# Patient Record
Sex: Female | Born: 1950
Health system: Southern US, Community
[De-identification: ages and names within clinical notes are randomized; demographics above are authoritative.]

## PROBLEM LIST (undated history)

## (undated) DIAGNOSIS — J449 Chronic obstructive pulmonary disease, unspecified: Secondary | ICD-10-CM

## (undated) DIAGNOSIS — F419 Anxiety disorder, unspecified: Secondary | ICD-10-CM

## (undated) DIAGNOSIS — J310 Chronic rhinitis: Secondary | ICD-10-CM

## (undated) DIAGNOSIS — H469 Unspecified optic neuritis: Secondary | ICD-10-CM

## (undated) DIAGNOSIS — J45909 Unspecified asthma, uncomplicated: Secondary | ICD-10-CM

## (undated) DIAGNOSIS — I10 Essential (primary) hypertension: Secondary | ICD-10-CM

## (undated) DIAGNOSIS — E119 Type 2 diabetes mellitus without complications: Secondary | ICD-10-CM

## (undated) HISTORY — PX: NASAL SINUS SURGERY: SHX719

---

## 2012-02-17 DIAGNOSIS — R0602 Shortness of breath: Secondary | ICD-10-CM

## 2020-03-14 ENCOUNTER — Ambulatory Visit: Admission: EM | Admit: 2020-03-14 | Disposition: A | Discharge status: Home / Self Care | Payer: Self-pay

## 2020-03-14 ENCOUNTER — Encounter (HOSPITAL_COMMUNITY): Payer: Self-pay | Admitting: Emergency Medicine

## 2020-03-14 ENCOUNTER — Emergency Department (HOSPITAL_COMMUNITY): Payer: Medicare HMO

## 2020-03-14 ENCOUNTER — Other Ambulatory Visit: Payer: Self-pay

## 2020-03-14 ENCOUNTER — Encounter: Payer: Self-pay | Admitting: Emergency Medicine

## 2020-03-14 ENCOUNTER — Emergency Department (HOSPITAL_COMMUNITY)
Admission: EM | Admit: 2020-03-14 | Discharge: 2020-03-14 | Disposition: A | Discharge status: Home / Self Care | Payer: Medicare HMO | Source: Home / Self Care | Attending: Emergency Medicine | Admitting: Emergency Medicine

## 2020-03-14 ENCOUNTER — Ambulatory Visit
Admission: EM | Admit: 2020-03-14 | Discharge: 2020-03-14 | Disposition: A | Discharge status: Home / Self Care | Payer: Medicare HMO

## 2020-03-14 DIAGNOSIS — H538 Other visual disturbances: Secondary | ICD-10-CM | POA: Insufficient documentation

## 2020-03-14 DIAGNOSIS — J449 Chronic obstructive pulmonary disease, unspecified: Secondary | ICD-10-CM | POA: Insufficient documentation

## 2020-03-14 DIAGNOSIS — E119 Type 2 diabetes mellitus without complications: Secondary | ICD-10-CM | POA: Insufficient documentation

## 2020-03-14 DIAGNOSIS — H547 Unspecified visual loss: Secondary | ICD-10-CM | POA: Diagnosis not present

## 2020-03-14 DIAGNOSIS — I10 Essential (primary) hypertension: Secondary | ICD-10-CM | POA: Insufficient documentation

## 2020-03-14 DIAGNOSIS — J0130 Acute sphenoidal sinusitis, unspecified: Secondary | ICD-10-CM | POA: Diagnosis not present

## 2020-03-14 HISTORY — DX: Chronic obstructive pulmonary disease, unspecified: J44.9

## 2020-03-14 HISTORY — DX: Essential (primary) hypertension: I10

## 2020-03-14 HISTORY — DX: Type 2 diabetes mellitus without complications: E11.9

## 2020-03-14 LAB — COMPREHENSIVE METABOLIC PANEL
ALT: 16 U/L (ref 0–44)
AST: 17 U/L (ref 15–41)
Albumin: 4.2 g/dL (ref 3.5–5.0)
Alkaline Phosphatase: 39 U/L (ref 38–126)
Anion gap: 11 (ref 5–15)
BUN: 14 mg/dL (ref 8–23)
CO2: 27 mmol/L (ref 22–32)
Calcium: 9.6 mg/dL (ref 8.9–10.3)
Chloride: 101 mmol/L (ref 98–111)
Creatinine, Ser: 1.05 mg/dL — ABNORMAL HIGH (ref 0.44–1.00)
GFR calc Af Amer: >60 mL/min (ref >60)
GFR calc non Af Amer: 53 mL/min — ABNORMAL LOW (ref >60)
Glucose, Bld: 108 mg/dL — ABNORMAL HIGH (ref 70–99)
Potassium: 3.1 mmol/L — ABNORMAL LOW (ref 3.5–5.1)
Sodium: 139 mmol/L (ref 135–145)
Total Bilirubin: 0.8 mg/dL (ref 0.3–1.2)
Total Protein: 7.7 g/dL (ref 6.5–8.1)

## 2020-03-14 LAB — CBC WITH DIFFERENTIAL/PLATELET
Abs Immature Granulocytes: 0.03 10*3/uL (ref 0.00–0.07)
Basophils Absolute: 0 10*3/uL (ref 0.0–0.1)
Basophils Relative: 0 %
Eosinophils Absolute: 0.2 10*3/uL (ref 0.0–0.5)
Eosinophils Relative: 2 %
HCT: 42.5 % (ref 36.0–46.0)
Hemoglobin: 14.3 g/dL (ref 12.0–15.0)
Immature Granulocytes: 0 %
Lymphocytes Relative: 26 %
Lymphs Abs: 2.4 10*3/uL (ref 0.7–4.0)
MCH: 29.5 pg (ref 26.0–34.0)
MCHC: 33.6 g/dL (ref 30.0–36.0)
MCV: 87.6 fL (ref 80.0–100.0)
Monocytes Absolute: 0.7 10*3/uL (ref 0.1–1.0)
Monocytes Relative: 8 %
Neutro Abs: 5.7 10*3/uL (ref 1.7–7.7)
Neutrophils Relative %: 64 %
Platelets: 267 10*3/uL (ref 150–400)
RBC: 4.85 MIL/uL (ref 3.87–5.11)
RDW: 13.7 % (ref 11.5–15.5)
WBC: 9 10*3/uL (ref 4.0–10.5)
nRBC: 0 % (ref 0.0–0.2)

## 2020-03-14 IMAGING — CT CT HEAD W/O CM
3 series · 16 of 47 positions shown, 19 images · non-contrast
Comparison: None.

CLINICAL DATA: Monocular vision loss

EXAM:
CT HEAD WITHOUT CONTRAST
TECHNIQUE: Contiguous axial images were obtained from the base of the skull
through the vertex without intravenous contrast.

[Series 2: head w o · axial · 0.39mm/px · z∈[+343,+468]mm · 10 of 31 slices shown, 13 images]
[im 3/31  brain]
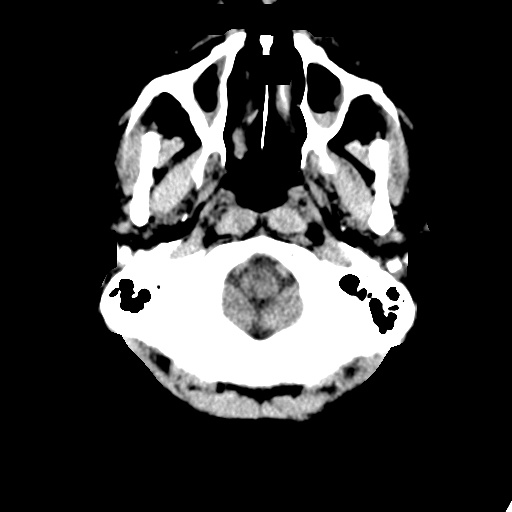
[im 3/31  bone]
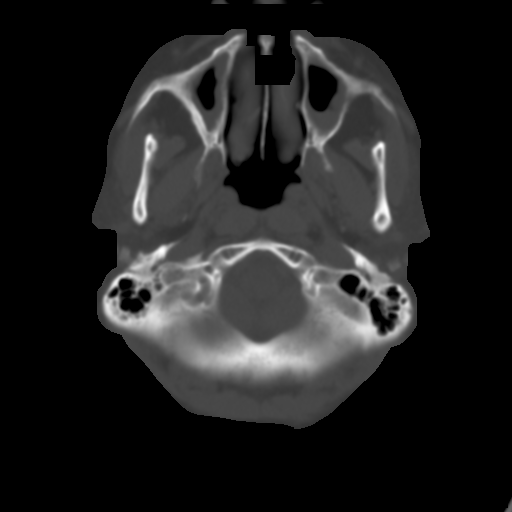
[im 6/31  brain]
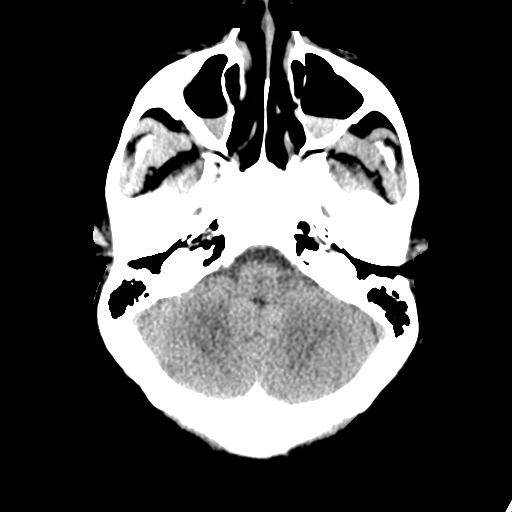
[im 9/31  brain]
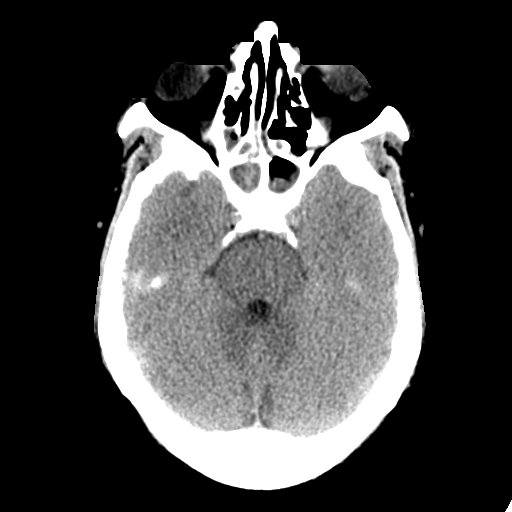
[im 11/31  brain]
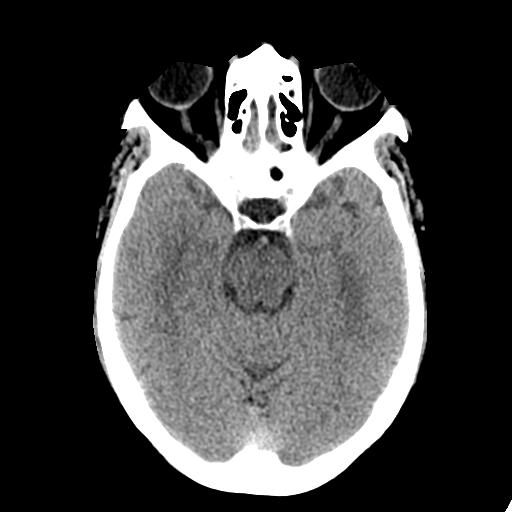
[im 14/31  brain]
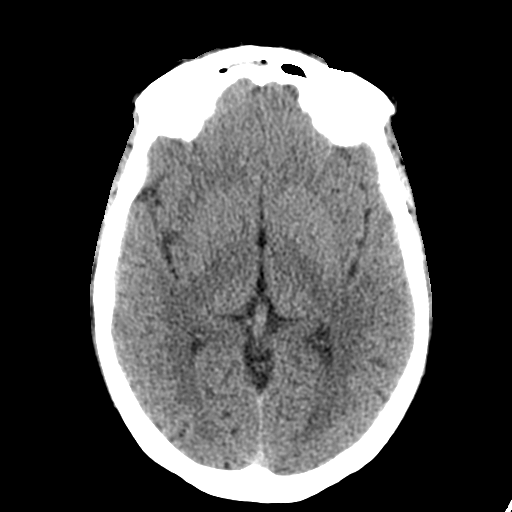
[im 14/31  bone]
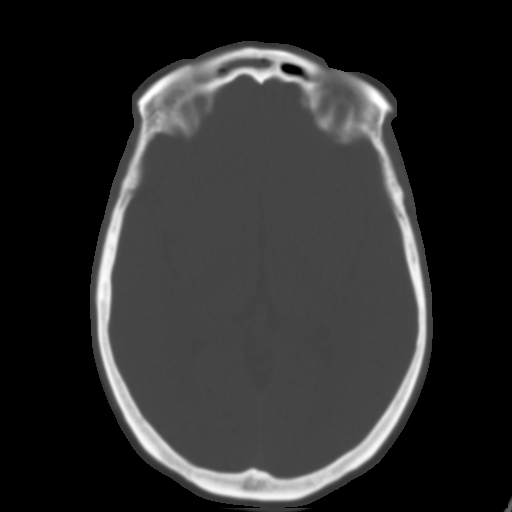
[im 17/31  brain]
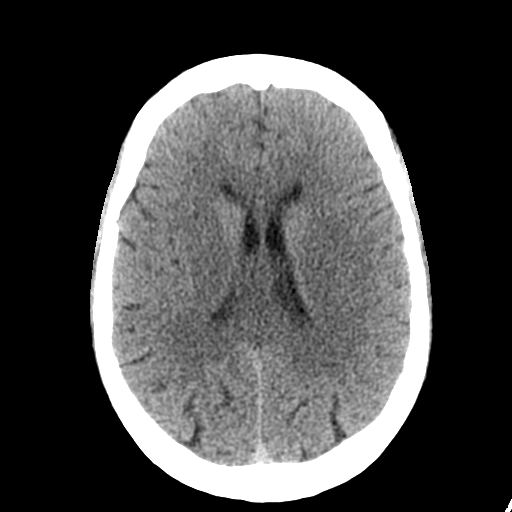
[im 20/31  brain]
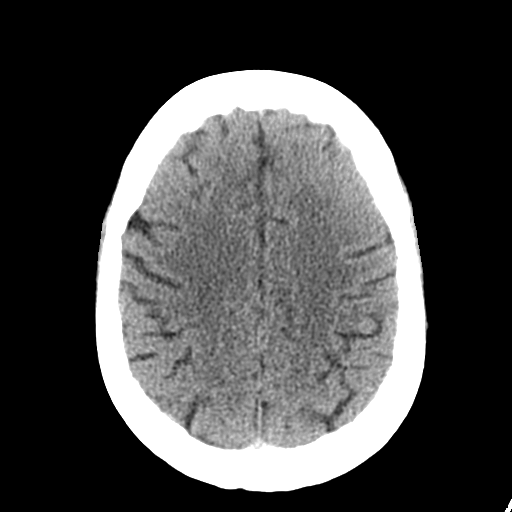
[im 23/31  brain]
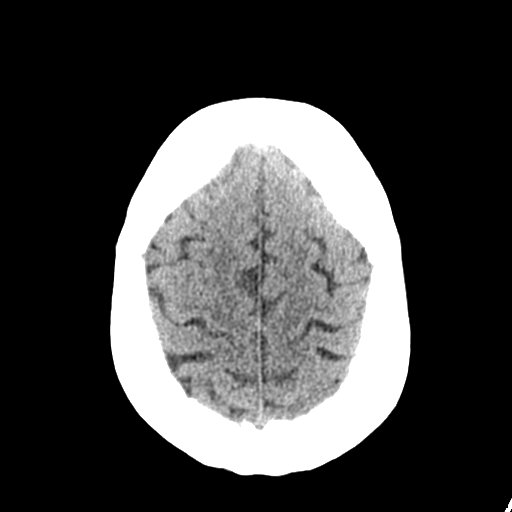
[im 25/31  brain]
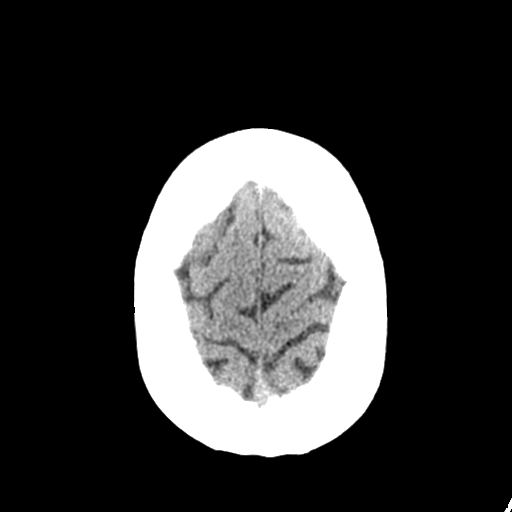
[im 25/31  bone]
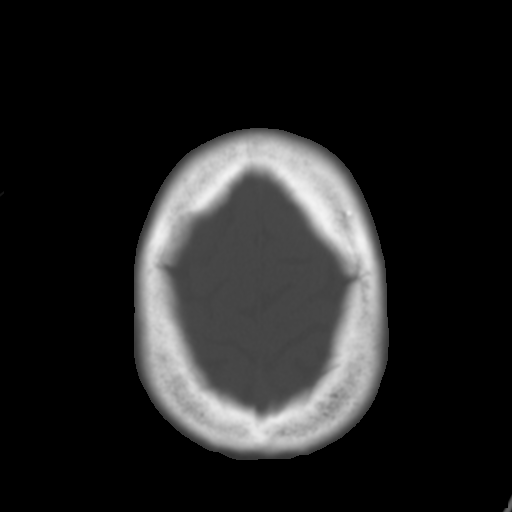
[im 28/31  brain]
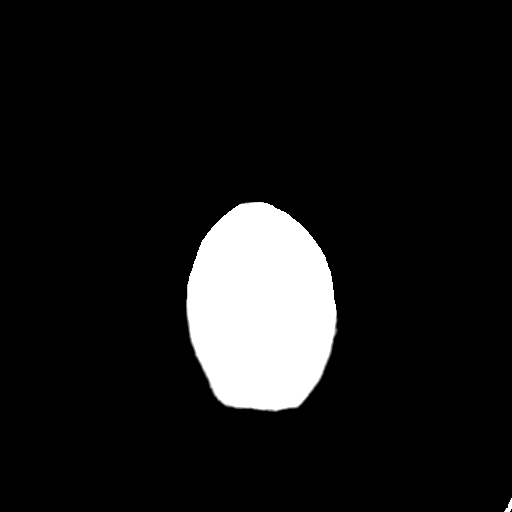

[Series 3: coronal soft · coronal · 0.37mm/px · 3 of 67 slices shown]
[im 23/67  brain]
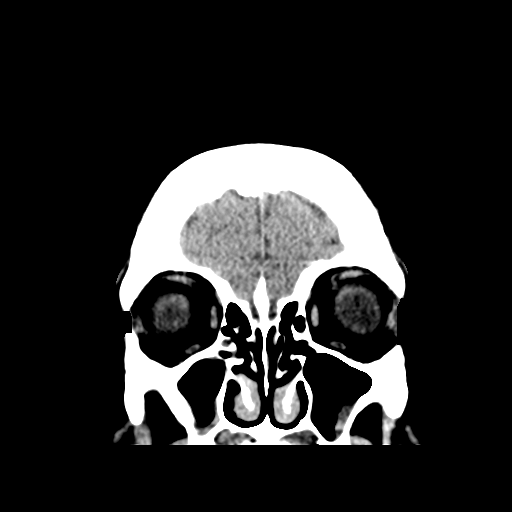
[im 30/67  brain]
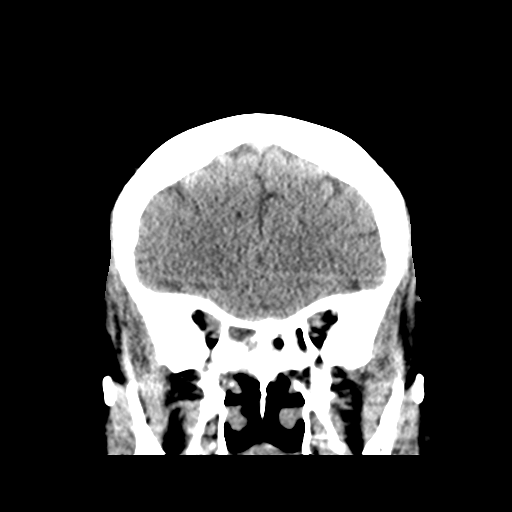
[im 37/67  brain]
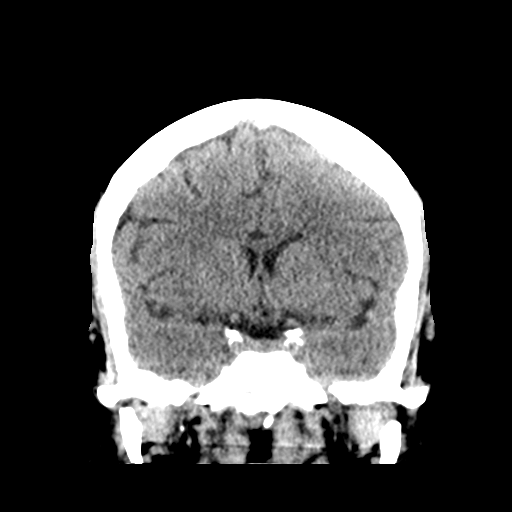

[Series 4: sagittal soft · sagittal · 0.32mm/px · 3 of 59 slices shown]
[im 20/59  brain]
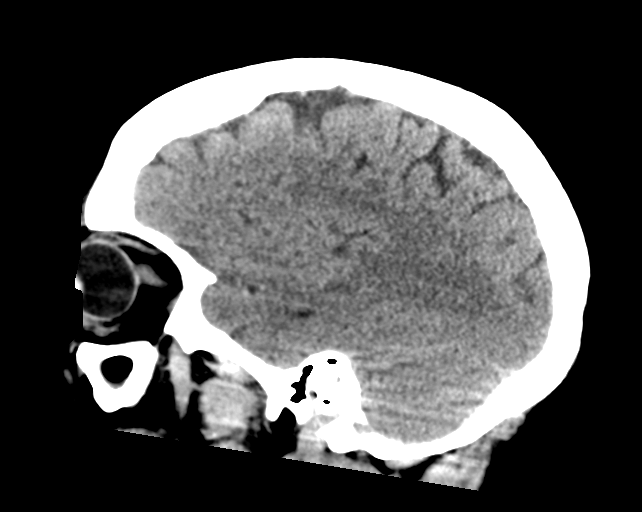
[im 30/59  brain]
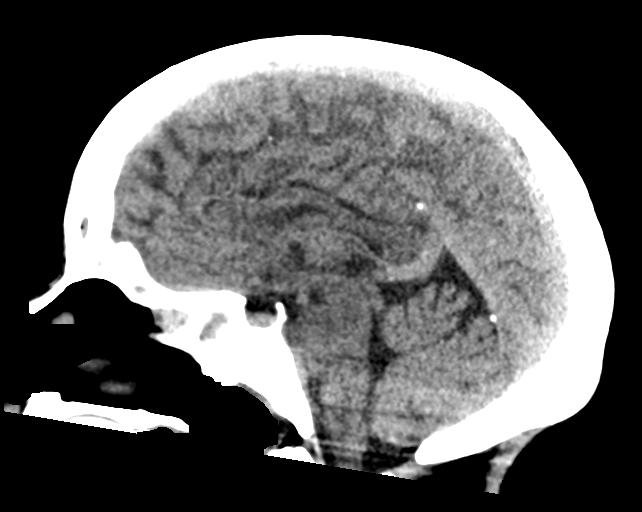
[im 39/59  brain]
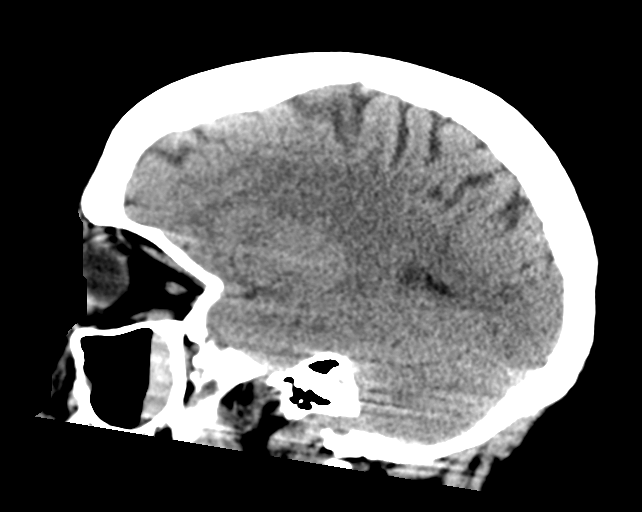

[16 of 47 positions shown; findings below may reference images not displayed]

FINDINGS: Brain: No evidence of acute territorial infarction, hemorrhage,
hydrocephalus,extra-axial collection or mass lesion/mass effect.
Normal gray-white differentiation. Ventricles are normal in size and
contour.

Vascular: No hyperdense vessel or unexpected calcification.

Skull: The skull is intact. No fracture or focal lesion identified.

Sinuses/Orbits: Fluid and mucosal thickening seen within the
bilateral maxillary sinuses and ethmoid air cells. There is also
complete opacification of the sphenoid air cells and right frontal
sinus. The orbits and globes intact.

Other: None
IMPRESSION: No acute intracranial abnormality.

Findings which could be suggestive of pansinusitis.

## 2020-03-14 NOTE — ED Triage Notes (Signed)
Blurred vision to right eye started yesterday about 1430.  Denies any problem with speech.  NIHSS negative.  Dr Effie Shy in triage.

## 2020-03-14 NOTE — ED Triage Notes (Signed)
Blurry vision to RT eye and headache that started yesterday evening approx 1430.  Denies any injury

## 2020-03-14 NOTE — ED Provider Notes (Signed)
[facility] EMERGENCY DEPARTMENT Provider Note   CSN: [id number] Arrival date & time: 03/14/20  1623     History Chief Complaint  Patient presents with  . Blurred Vision    Deborah Marsh is a 69 y.o. female.  HPI 4:45 PM-I received a call from triage nurse, regarding patient having blurred vision, waxing and waning for 24 hours.  Patient evaluated triage by me.  She states she has chronic visual loss left eye, central vision.  No change in vision from left eye.  She states her right eye is blurry, without any loss of visual field.  She is worried about a detached retina, because her mother had that.  Visual acuity, using Snellen chart-bilateral 20/70, OD 20/70, OS 20/100 -1.  She states that she has been seen by an eye doctor, Dr. Mayford Knife who was advised her that she has a cataract and needs to be worked on.    Past Medical History:  Diagnosis Date  . COPD (chronic obstructive pulmonary disease) (HCC)   . Diabetes mellitus without complication (HCC)   . Hypertension     There are no problems to display for this patient.   No past surgical history on file.   OB History   No obstetric history on file.     No family history on file.  Social History   Tobacco Use  . Smoking status: Never Smoker  . Smokeless tobacco: Never Used  Substance Use Topics  . Alcohol use: Never  . Drug use: Never    Home Medications Prior to Admission medications   Not on File    Allergies    Patient has no known allergies.  Review of Systems   Review of Systems  All other systems reviewed and are negative.   Physical Exam Updated Vital Signs There were no vitals taken for this visit.  Physical Exam Vitals and nursing note reviewed.  Constitutional:      General: She is not in acute distress.    Appearance: She is well-developed. She is obese. She is not ill-appearing, toxic-appearing or diaphoretic.  HENT:     Head: Normocephalic and atraumatic.     Right  Ear: External ear normal.     Left Ear: External ear normal.  Eyes:     Conjunctiva/sclera: Conjunctivae normal.     Pupils: Pupils are equal, round, and reactive to light.     Comments: External ocular muscles are intact.  Funduscopic exam eye-normal optic disc, no visualized vitreous lesions.  Generalized arteriolar narrowing, likely because of chronic disease.  No exudates or abnormalities visualized of the retina.  No hyphema.  No conjunctivitis.  Neck:     Trachea: Phonation normal.  Cardiovascular:     Rate and Rhythm: Normal rate and regular rhythm.     Heart sounds: Normal heart sounds.  Pulmonary:     Effort: Pulmonary effort is normal. No respiratory distress.     Breath sounds: Normal breath sounds. No stridor.  Abdominal:     General: There is no distension.  Musculoskeletal:        General: Normal range of motion.     Cervical back: Normal range of motion and neck supple.  Skin:    General: Skin is warm and dry.  Neurological:     Mental Status: She is alert and oriented to person, place, and time.     Cranial Nerves: No cranial nerve deficit.     Sensory: No sensory deficit.  Motor: No abnormal muscle tone.     Coordination: Coordination normal.  Psychiatric:        Mood and Affect: Mood normal.        Behavior: Behavior normal.        Thought Content: Thought content normal.        Judgment: Judgment normal.     ED Results / Procedures / Treatments   Labs (all labs ordered are listed, but only abnormal results are displayed) Labs Reviewed - No data to display  EKG None  Radiology No results found.  Procedures Procedures (including critical care time)  Medications Ordered in ED Medications - No data to display  ED Course  I have reviewed the triage vital signs and the nursing notes.  Pertinent labs & imaging results that were available during my care of the patient were reviewed by me and considered in my medical decision making (see chart for  details).  Clinical Course as of Mar 15 2147  Wynelle Link Mar 14, 2020  1824 Normal except potassium low, glucose high, creatinine high  Comprehensive metabolic panel(!) [EW]  1824 Normal  CBC with Differential [EW]  1825 No acute intracranial abnormality.  Sinusitis present, likely pansinusitis; per radiologist interpretation  CT Head Wo Contrast [EW]    Clinical Course User Index [EW] Mancel Bale, MD   MDM Rules/Calculators/A&P                           Patient Vitals for the past 24 hrs:  BP Temp Temp src Pulse Resp SpO2 Height Weight  03/14/20 1645 (!) 162/76 98.4 F (36.9 C) Oral (!) 49 16 99 % 5\' 4"  (1.626 m) 84.8 kg    9:52 PM Reevaluation with update and discussion. After initial assessment and treatment, an updated evaluation reveals no additional changes, findings discussed with the patient and all questions were answered.   Medical Decision Making:  This patient is presenting for evaluation of blurry vision right eye, which does require a range of treatment options, and is a complaint that involves a moderate risk of morbidity and mortality. The differential diagnoses include worsening chronic eye disorder, retinal detachment, conjunctivitis, CVA. I decided to review old records, and in summary patient with chronic vision loss left eye, now with blurriness of the right eye, for about a day. No trauma to the right eye. She states she has history of cataract in the right eye..  I did not require additional historical information from anyone.  Clinical Laboratory Tests Ordered, included CBC and Metabolic panel. Review indicates potassium low, glucose high, creatinine high, GFR low. Radiologic Tests Ordered, included CT head.  I independently Visualized: Radiographic images, which show no acute abnormality   Critical Interventions-clinical evaluation, laboratory testing, CT imaging, observation reassessment  After These Interventions, the Patient was reevaluated  and was found stable for discharge. Nonspecific blurry vision right eye. Visual acuity is mildly depressed, but no baseline acuity testing is available. No indication for acute ophthalmologic emergency at this time. Stable for discharge with outpatient follow-up with ophthalmology.  CRITICAL CARE-no Performed by: Mancel Bale  Nursing Notes Reviewed/ Care Coordinated Applicable Imaging Reviewed Interpretation of Laboratory Data incorporated into ED treatment  The patient appears reasonably screened and/or stabilized for discharge and I doubt any other medical condition or other [facility] requiring further screening, evaluation, or treatment in the ED at this time prior to discharge.  Plan: Home Medications-continue usual; Home Treatments-rest, fluids; return here  if the recommended treatment, does not improve the symptoms; Recommended follow up-ophthalmology follow-up for further evaluation and treatment as needed     Final Clinical Impression(s) / ED Diagnoses Final diagnoses:  None    Rx / DC Orders ED Discharge Orders    None       Mancel Bale, MD 03/15/20 0005

## 2020-03-14 NOTE — ED Notes (Signed)
Patient is being discharged from the Urgent Care and sent to the Emergency Department via pov . Per Doyce Para, patient is in need of higher level of care due to r/o stroke. Patient is aware and verbalizes understanding of plan of care.  Vitals:   03/14/20 1609  BP: 136/77  Pulse: (!) 49  Resp: 18  Temp: 98.5 F (36.9 C)  SpO2: 97%

## 2020-03-14 NOTE — Discharge Instructions (Addendum)
The CT scan your blood work was normal today.  Call the on-call ophthalmologist for an appointment to be seen for evaluation of your blurred vision.

## 2020-03-15 ENCOUNTER — Encounter (HOSPITAL_COMMUNITY): Payer: Self-pay

## 2020-03-15 ENCOUNTER — Emergency Department (HOSPITAL_COMMUNITY): Payer: Medicare HMO

## 2020-03-15 ENCOUNTER — Inpatient Hospital Stay (HOSPITAL_COMMUNITY)
Admission: EM | Admit: 2020-03-15 | Discharge: 2020-03-18 | DRG: 153 | Disposition: A | Discharge status: Home Health | Payer: Medicare HMO | Attending: Internal Medicine | Admitting: Internal Medicine

## 2020-03-15 DIAGNOSIS — Z87891 Personal history of nicotine dependence: Secondary | ICD-10-CM

## 2020-03-15 DIAGNOSIS — Z20822 Contact with and (suspected) exposure to covid-19: Secondary | ICD-10-CM | POA: Diagnosis present

## 2020-03-15 DIAGNOSIS — H269 Unspecified cataract: Secondary | ICD-10-CM | POA: Diagnosis present

## 2020-03-15 DIAGNOSIS — H4710 Unspecified papilledema: Secondary | ICD-10-CM | POA: Diagnosis present

## 2020-03-15 DIAGNOSIS — Z79899 Other long term (current) drug therapy: Secondary | ICD-10-CM

## 2020-03-15 DIAGNOSIS — I10 Essential (primary) hypertension: Secondary | ICD-10-CM | POA: Diagnosis present

## 2020-03-15 DIAGNOSIS — E669 Obesity, unspecified: Secondary | ICD-10-CM | POA: Diagnosis present

## 2020-03-15 DIAGNOSIS — Z6832 Body mass index (BMI) 32.0-32.9, adult: Secondary | ICD-10-CM

## 2020-03-15 DIAGNOSIS — K5900 Constipation, unspecified: Secondary | ICD-10-CM | POA: Diagnosis present

## 2020-03-15 DIAGNOSIS — J0130 Acute sphenoidal sinusitis, unspecified: Principal | ICD-10-CM | POA: Diagnosis present

## 2020-03-15 DIAGNOSIS — R8271 Bacteriuria: Secondary | ICD-10-CM | POA: Diagnosis present

## 2020-03-15 DIAGNOSIS — H5461 Unqualified visual loss, right eye, normal vision left eye: Secondary | ICD-10-CM

## 2020-03-15 DIAGNOSIS — H47091 Other disorders of optic nerve, not elsewhere classified, right eye: Secondary | ICD-10-CM | POA: Diagnosis present

## 2020-03-15 DIAGNOSIS — H543 Unqualified visual loss, both eyes: Secondary | ICD-10-CM | POA: Diagnosis present

## 2020-03-15 DIAGNOSIS — N390 Urinary tract infection, site not specified: Secondary | ICD-10-CM | POA: Diagnosis present

## 2020-03-15 DIAGNOSIS — R7303 Prediabetes: Secondary | ICD-10-CM | POA: Diagnosis present

## 2020-03-15 DIAGNOSIS — J449 Chronic obstructive pulmonary disease, unspecified: Secondary | ICD-10-CM | POA: Diagnosis present

## 2020-03-15 LAB — RAPID URINE DRUG SCREEN, HOSP PERFORMED
Amphetamines: NOT DETECTED
Barbiturates: NOT DETECTED
Benzodiazepines: POSITIVE — AB
Cocaine: NOT DETECTED
Opiates: NOT DETECTED
Tetrahydrocannabinol: NOT DETECTED

## 2020-03-15 LAB — CBC
HCT: 45.3 % (ref 36.0–46.0)
Hemoglobin: 14.9 g/dL (ref 12.0–15.0)
MCH: 28.6 pg (ref 26.0–34.0)
MCHC: 32.9 g/dL (ref 30.0–36.0)
MCV: 86.9 fL (ref 80.0–100.0)
Platelets: 272 10*3/uL (ref 150–400)
RBC: 5.21 MIL/uL — ABNORMAL HIGH (ref 3.87–5.11)
RDW: 13.5 % (ref 11.5–15.5)
WBC: 9.1 10*3/uL (ref 4.0–10.5)
nRBC: 0 % (ref 0.0–0.2)

## 2020-03-15 LAB — URINALYSIS, ROUTINE W REFLEX MICROSCOPIC
Bilirubin Urine: NEGATIVE
Glucose, UA: NEGATIVE mg/dL
Hgb urine dipstick: NEGATIVE
Ketones, ur: 20 mg/dL — AB
Nitrite: NEGATIVE
Protein, ur: NEGATIVE mg/dL
Specific Gravity, Urine: 1.013 (ref 1.005–1.030)
pH: 6 (ref 5.0–8.0)

## 2020-03-15 LAB — DIFFERENTIAL
Abs Immature Granulocytes: 0.03 10*3/uL (ref 0.00–0.07)
Basophils Absolute: 0 10*3/uL (ref 0.0–0.1)
Basophils Relative: 0 %
Eosinophils Absolute: 0.3 10*3/uL (ref 0.0–0.5)
Eosinophils Relative: 3 %
Immature Granulocytes: 0 %
Lymphocytes Relative: 18 %
Lymphs Abs: 1.7 10*3/uL (ref 0.7–4.0)
Monocytes Absolute: 0.6 10*3/uL (ref 0.1–1.0)
Monocytes Relative: 7 %
Neutro Abs: 6.5 10*3/uL (ref 1.7–7.7)
Neutrophils Relative %: 72 %

## 2020-03-15 LAB — COMPREHENSIVE METABOLIC PANEL
ALT: 16 U/L (ref 0–44)
AST: 14 U/L — ABNORMAL LOW (ref 15–41)
Albumin: 4.1 g/dL (ref 3.5–5.0)
Alkaline Phosphatase: 40 U/L (ref 38–126)
Anion gap: 10 (ref 5–15)
BUN: 11 mg/dL (ref 8–23)
CO2: 30 mmol/L (ref 22–32)
Calcium: 9.8 mg/dL (ref 8.9–10.3)
Chloride: 100 mmol/L (ref 98–111)
Creatinine, Ser: 0.92 mg/dL (ref 0.44–1.00)
GFR calc Af Amer: >60 mL/min (ref >60)
GFR calc non Af Amer: >60 mL/min (ref >60)
Glucose, Bld: 95 mg/dL (ref 70–99)
Potassium: 3.6 mmol/L (ref 3.5–5.1)
Sodium: 140 mmol/L (ref 135–145)
Total Bilirubin: 1 mg/dL (ref 0.3–1.2)
Total Protein: 7.6 g/dL (ref 6.5–8.1)

## 2020-03-15 LAB — PROTIME-INR
INR: 1.1 (ref 0.8–1.2)
Prothrombin Time: 13.6 seconds (ref 11.4–15.2)

## 2020-03-15 LAB — APTT: aPTT: 29 seconds (ref 24–36)

## 2020-03-15 LAB — ETHANOL: Alcohol, Ethyl (B): <10 mg/dL (ref <10)

## 2020-03-15 IMAGING — MR MR HEAD W/O CM
12 of 14 series · 40 of 48 positions shown · IV contrast (gadavist)
Comparison: Head CT [DATE]

CLINICAL DATA: Worsening right eye vision loss. Concern for stroke.

EXAM:
MR HEAD WITHOUT CONTRAST
MR CIRCLE OF WILLIS WITHOUT CONTRAST
MRA OF THE NECK WITHOUT AND WITH CONTRAST
TECHNIQUE: Multiplanar, multiecho pulse sequences of the brain, circle of
willis and surrounding structures were obtained without intravenous
contrast. Angiographic images of the neck were obtained using MRA
technique without and with intravenous contrast.
CONTRAST:  8mL GADAVIST GADOBUTROL 1 MMOL/ML IV SOLN

[Series 5: ax dwi_tracew · axial · 3.0mm · 1.44mm/px · z∈[-98,+42]mm · 7 of 88 slices shown]
[im 1/88]
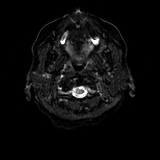
[im 15/88]
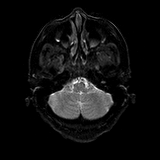
[im 30/88]
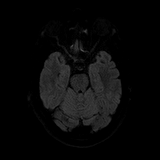
[im 44/88]
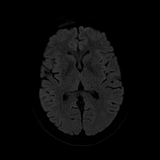
[im 59/88]
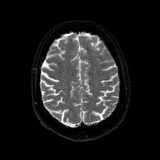
[im 73/88]
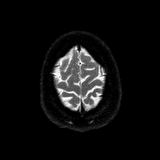
[im 88/88]
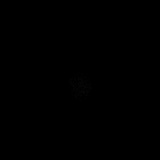

[Series 6: ax dwi_adc · axial · 3.0mm · 1.44mm/px · z∈[-98,+42]mm · 4 of 44 slices shown]
[im 1/44]
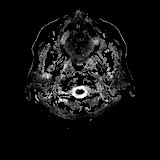
[im 15/44]
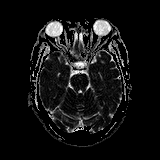
[im 29/44]
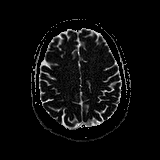
[im 44/44]
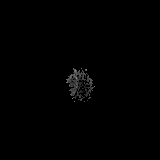

[Series 7: cor dwi_tracew · coronal · 5.0mm · 1.44mm/px · 5 of 64 slices shown]
[im 1/64]
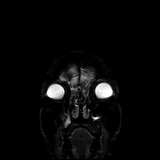
[im 16/64]
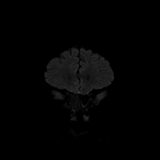
[im 32/64]
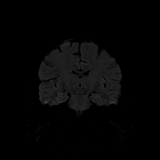
[im 48/64]
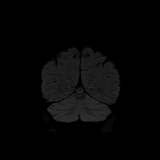
[im 64/64]
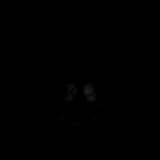

[Series 8: cor dwi_adc · coronal · 5.0mm · 1.44mm/px · 3 of 32 slices shown]
[im 1/32]
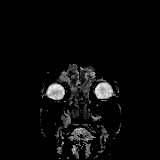
[im 16/32]
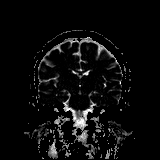
[im 32/32]
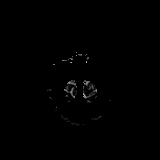

[Series 13: T1 · sagittal · 5.0mm · 0.72mm/px · 2 of 23 slices shown]
[im 1/23]
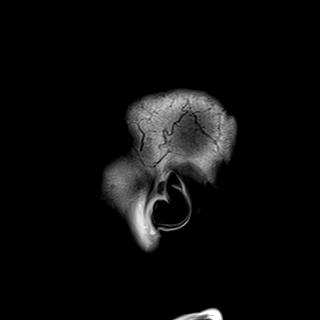
[im 23/23]
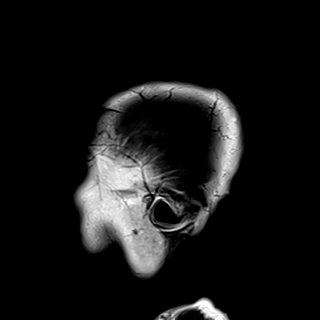

[Series 14: T2 · axial · 5.0mm · 0.75mm/px · z∈[-102,+39]mm · 2 of 25 slices shown]
[im 1/25]
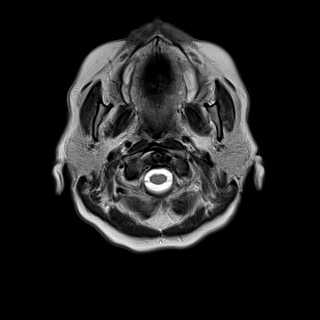
[im 25/25]
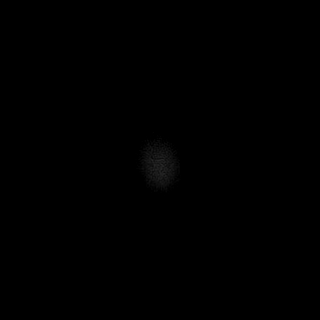

[Series 15: swi_images · axial · 3.0mm · 0.94mm/px · z∈[-119,+55]mm · 5 of 60 slices shown]
[im 1/60]
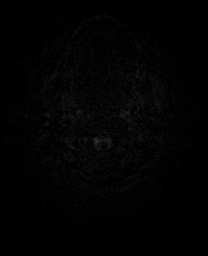
[im 15/60]
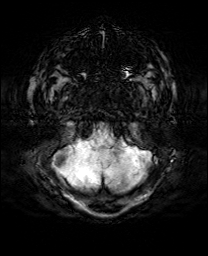
[im 30/60]
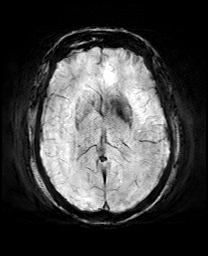
[im 45/60]
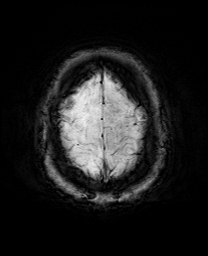
[im 60/60]
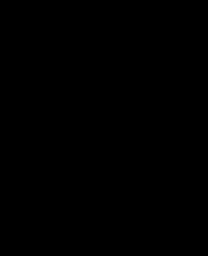

[Series 16: mip_images(sw) · axial · 24.0mm · 0.94mm/px · z∈[-108,+45]mm · 4 of 53 slices shown]
[im 1/53]
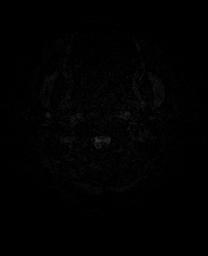
[im 18/53]
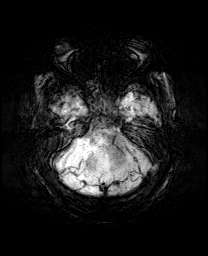
[im 35/53]
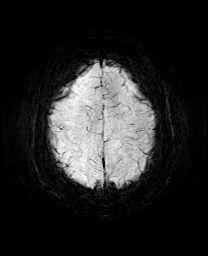
[im 53/53]
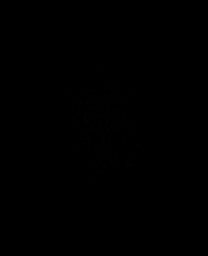

[Series 17: FLAIR · axial · 3.0mm · 0.47mm/px · z∈[-102,+39]mm · 2 of 25 slices shown]
[im 1/25]
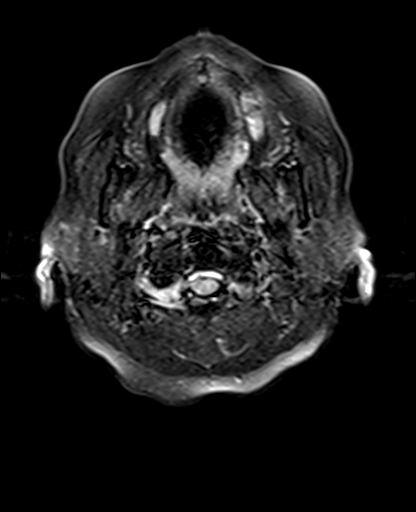
[im 25/25]
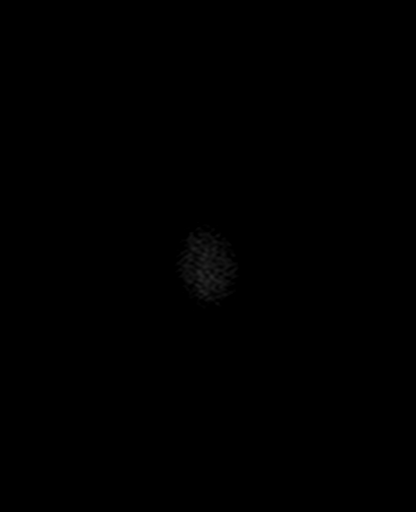

[Series 38: T2 post-contrast · coronal · 5.0mm · 0.72mm/px · 2 of 28 slices shown]
[im 1/28]
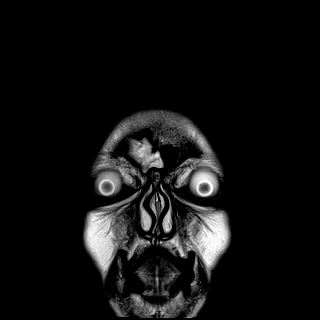
[im 28/28]
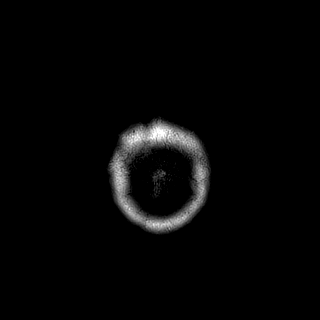

[Series 40: T1 post-contrast · coronal · 5.0mm · 0.34mm/px · 2 of 29 slices shown (1 of 2)]
[im 1/29]
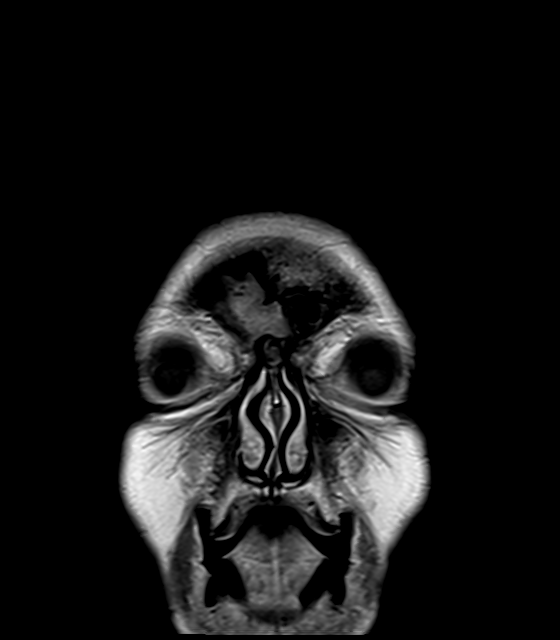
[im 29/29]
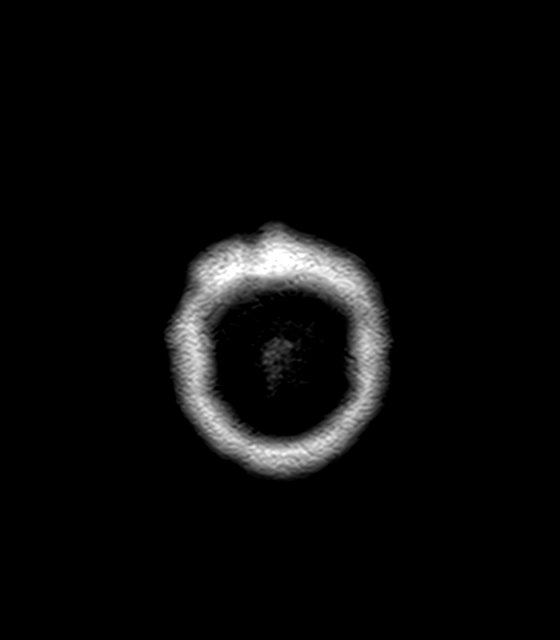

[Series 41: T1 post-contrast · sagittal · 5.0mm · 0.72mm/px · 2 of 23 slices shown (2 of 2)]
[im 1/23]
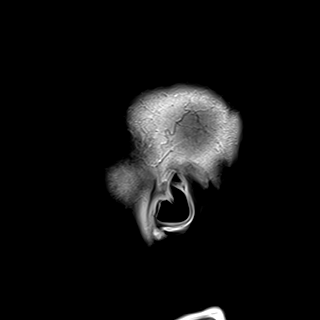
[im 23/23]
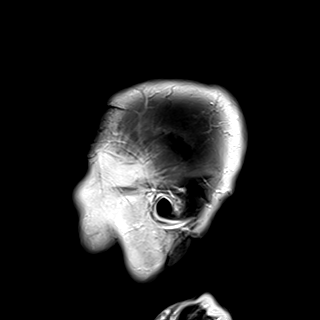

[40 of 48 positions shown; findings below may reference images not displayed]

FINDINGS: MR HEAD FINDINGS

Brain: There is no evidence of an acute infarct, intracranial
hemorrhage, mass, midline shift, or extra-axial fluid collection.
The ventricles and sulci are normal. Small foci of T2 hyperintensity
in the cerebral white matter bilaterally are nonspecific but
compatible with mild chronic small vessel ischemic disease. No
abnormal enhancement is identified.

Vascular: Major intracranial vascular flow voids are preserved.

Skull and upper cervical spine: Unremarkable bone marrow signal.

Sinuses/Orbits: Unremarkable orbits. Widespread mucosal thickening
in the paranasal sinuses with complete opacification of the right
frontal sinus and right sphenoid sinus and subtotal opacification of
the left sphenoid sinus. Bilateral maxillary sinus fluid. Clear
mastoid air cells.

Other: None.

MR CIRCLE OF WILLIS FINDINGS

The intracranial vertebral arteries are widely patent to the
basilar. The left PICA and right AICA appear dominant. Widely patent
SCA origins are seen bilaterally. The basilar artery is widely
patent. There are small posterior communicating arteries
bilaterally. Both PCAs are patent without evidence of a significant
proximal stenosis.

The internal carotid arteries are patent from skull base to carotid
termini without evidence of a significant stenosis. Artifact is
noted in the proximal petrous ICA segments bilaterally. ACAs and
MCAs are patent without evidence of a proximal branch occlusion or
significant proximal stenosis. No aneurysm is identified.

MRA NECK FINDINGS

The study is mildly motion degraded.

There is a standard 3 vessel aortic arch. The brachiocephalic and
subclavian arteries are widely patent.

The common carotid and cervical internal carotid arteries are patent
bilaterally without evidence of significant stenosis or dissection.
There is mild beading of the mid to distal right cervical ICA.

The vertebral arteries are patent and codominant with antegrade flow
bilaterally. No significant vertebral artery stenosis or dissection
is identified.
IMPRESSION: 1. No acute intracranial abnormality.
2. Mild chronic small vessel ischemic disease.
3. Pansinusitis.
4. Negative head MRA.
5. Widely patent cervical carotid and vertebral arteries.
6. Possible mild changes of fibromuscular dysplasia in the right
cervical ICA.

## 2020-03-15 IMAGING — MR MR MRA HEAD W/O CM
1 series · 18 of 48 positions shown · IV contrast (gadavist)
Comparison: Head CT [DATE]

CLINICAL DATA: Worsening right eye vision loss. Concern for stroke.

EXAM:
MR HEAD WITHOUT CONTRAST
MR CIRCLE OF WILLIS WITHOUT CONTRAST
MRA OF THE NECK WITHOUT AND WITH CONTRAST
TECHNIQUE: Multiplanar, multiecho pulse sequences of the brain, circle of
willis and surrounding structures were obtained without intravenous
contrast. Angiographic images of the neck were obtained using MRA
technique without and with intravenous contrast.
CONTRAST:  8mL GADAVIST GADOBUTROL 1 MMOL/ML IV SOLN

[Series 9: 3d cow · axial · 0.5mm · 0.41mm/px · z∈[-94,-14]mm · 18 of 172 slices shown]
[im 1/172]
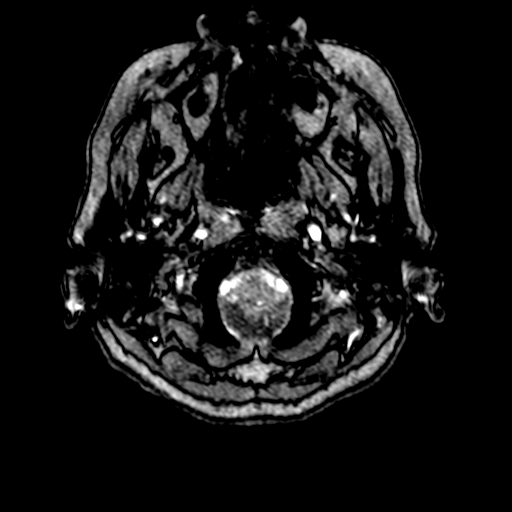
[im 4/172]
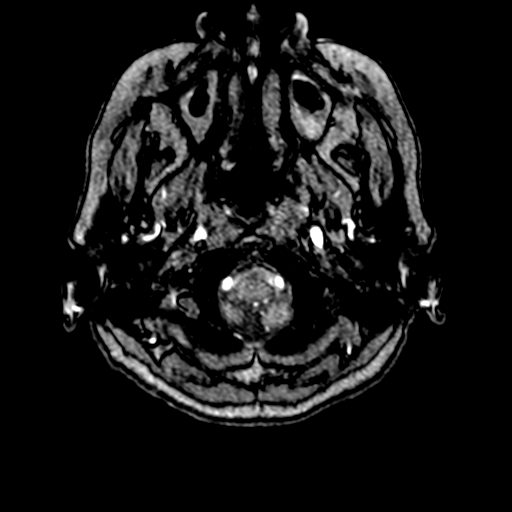
[im 8/172]
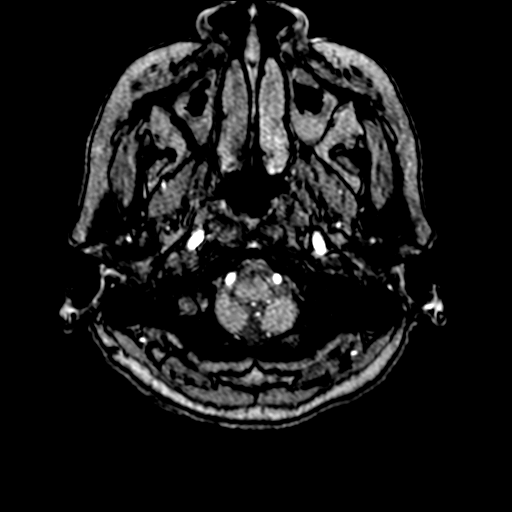
[im 11/172]
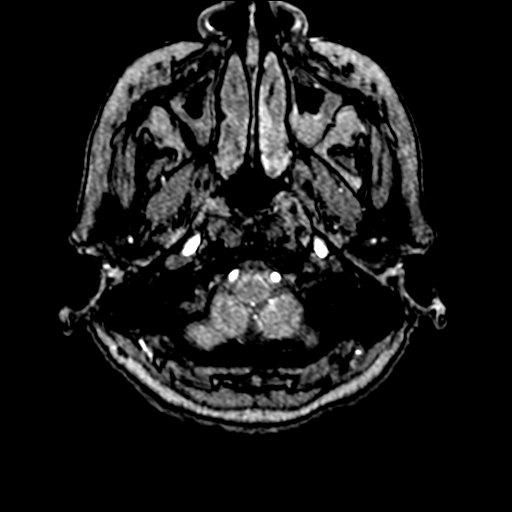
[im 15/172]
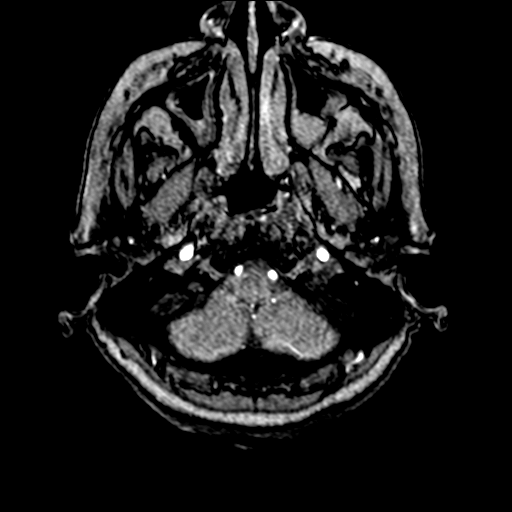
[im 19/172]
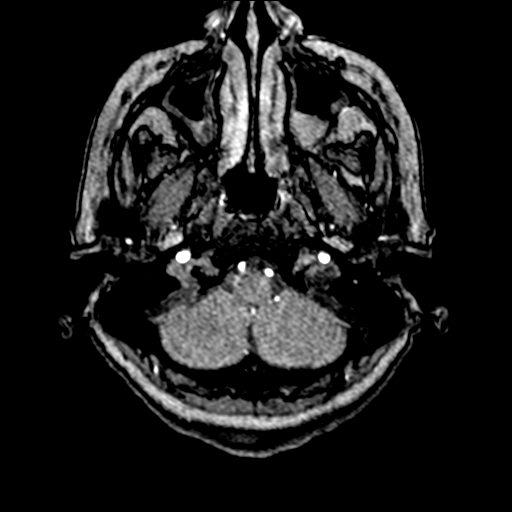
[im 22/172]
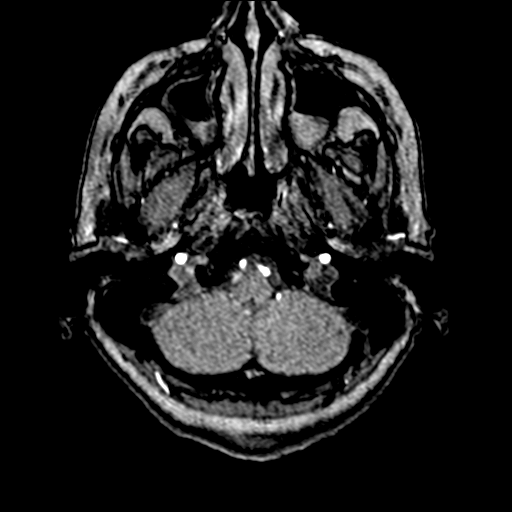
[im 26/172]
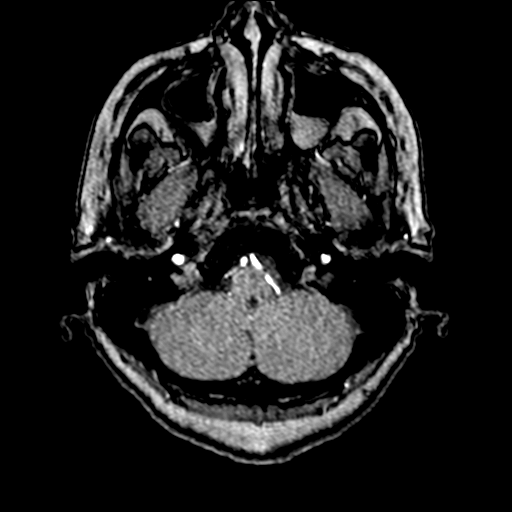
[im 30/172]
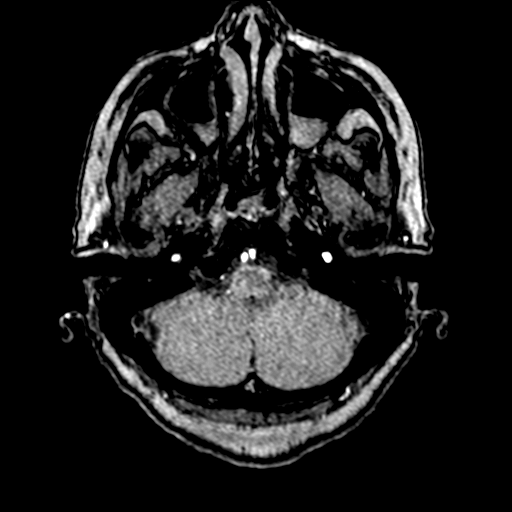
[im 33/172]
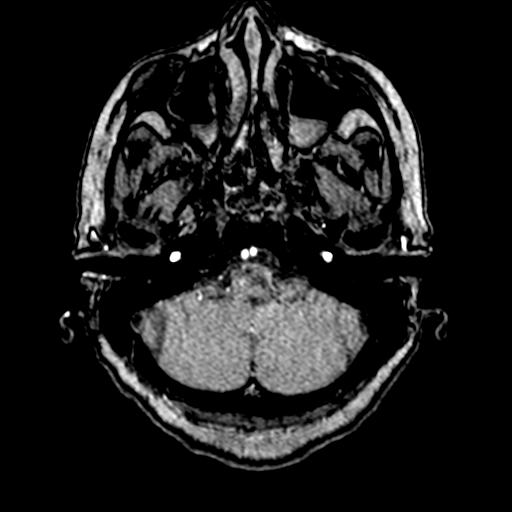
[im 55/172]
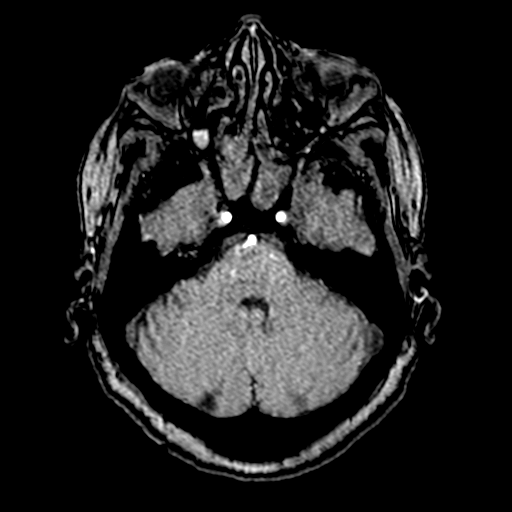
[im 77/172]
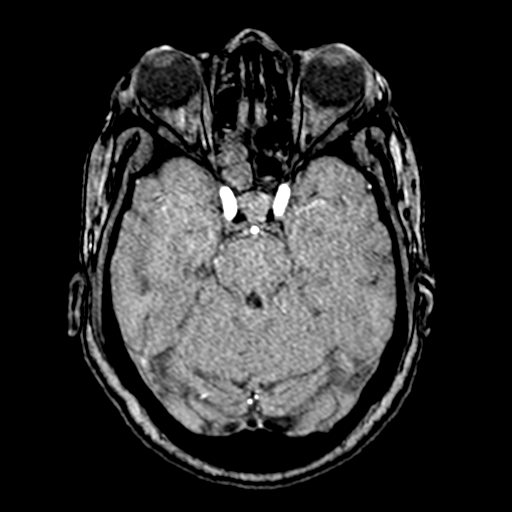
[im 88/172]
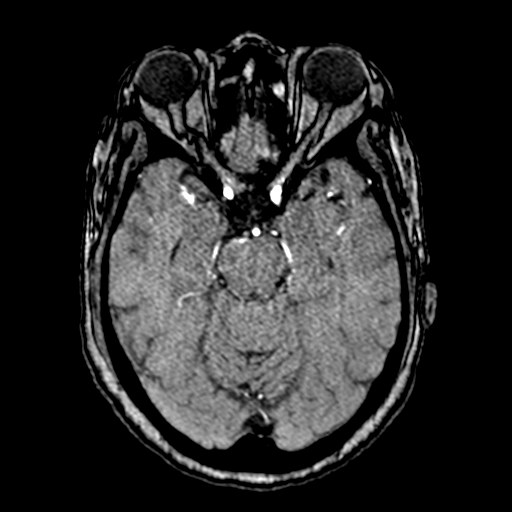
[im 99/172]
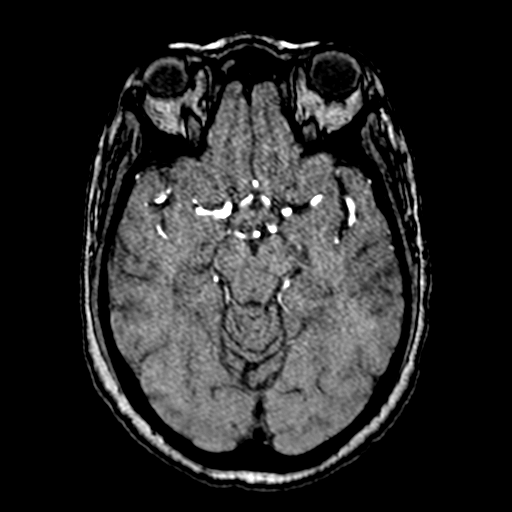
[im 121/172]
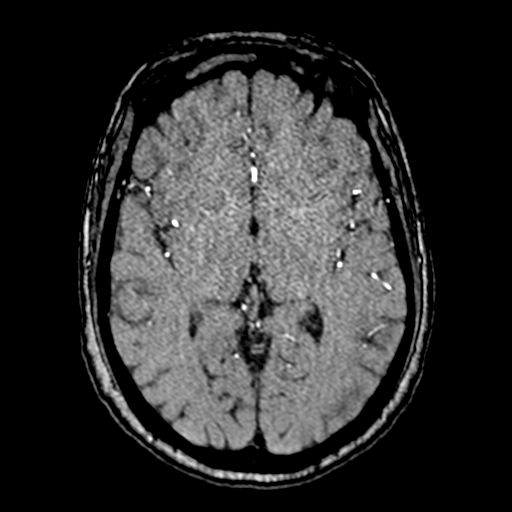
[im 142/172]
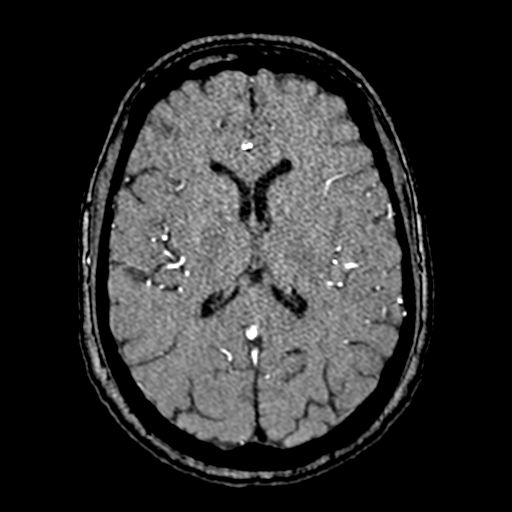
[im 146/172]
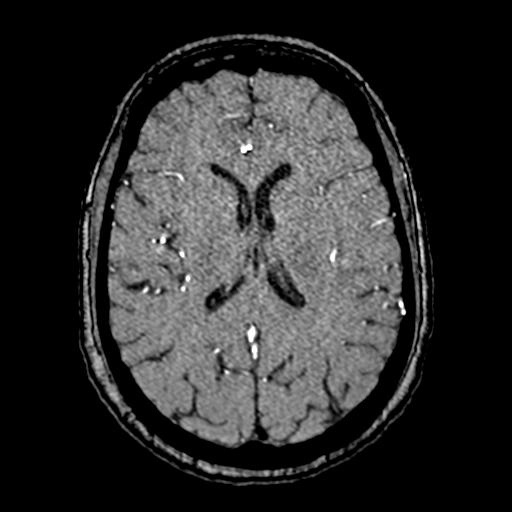
[im 164/172]
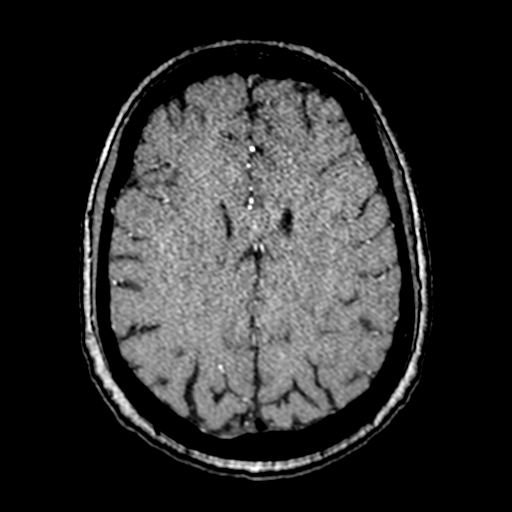

[18 of 48 positions shown; findings below may reference images not displayed]

FINDINGS: MR HEAD FINDINGS

Brain: There is no evidence of an acute infarct, intracranial
hemorrhage, mass, midline shift, or extra-axial fluid collection.
The ventricles and sulci are normal. Small foci of T2 hyperintensity
in the cerebral white matter bilaterally are nonspecific but
compatible with mild chronic small vessel ischemic disease. No
abnormal enhancement is identified.

Vascular: Major intracranial vascular flow voids are preserved.

Skull and upper cervical spine: Unremarkable bone marrow signal.

Sinuses/Orbits: Unremarkable orbits. Widespread mucosal thickening
in the paranasal sinuses with complete opacification of the right
frontal sinus and right sphenoid sinus and subtotal opacification of
the left sphenoid sinus. Bilateral maxillary sinus fluid. Clear
mastoid air cells.

Other: None.

MR CIRCLE OF WILLIS FINDINGS

The intracranial vertebral arteries are widely patent to the
basilar. The left PICA and right AICA appear dominant. Widely patent
SCA origins are seen bilaterally. The basilar artery is widely
patent. There are small posterior communicating arteries
bilaterally. Both PCAs are patent without evidence of a significant
proximal stenosis.

The internal carotid arteries are patent from skull base to carotid
termini without evidence of a significant stenosis. Artifact is
noted in the proximal petrous ICA segments bilaterally. ACAs and
MCAs are patent without evidence of a proximal branch occlusion or
significant proximal stenosis. No aneurysm is identified.

MRA NECK FINDINGS

The study is mildly motion degraded.

There is a standard 3 vessel aortic arch. The brachiocephalic and
subclavian arteries are widely patent.

The common carotid and cervical internal carotid arteries are patent
bilaterally without evidence of significant stenosis or dissection.
There is mild beading of the mid to distal right cervical ICA.

The vertebral arteries are patent and codominant with antegrade flow
bilaterally. No significant vertebral artery stenosis or dissection
is identified.
IMPRESSION: 1. No acute intracranial abnormality.
2. Mild chronic small vessel ischemic disease.
3. Pansinusitis.
4. Negative head MRA.
5. Widely patent cervical carotid and vertebral arteries.
6. Possible mild changes of fibromuscular dysplasia in the right
cervical ICA.

## 2020-03-15 IMAGING — MR MR MRA NECK WO/W CM
8 of 9 series · 41 of 48 positions shown · IV contrast (Gadavist)
Comparison: Head CT [DATE]

CLINICAL DATA: Worsening right eye vision loss. Concern for stroke.

EXAM:
MR HEAD WITHOUT CONTRAST
MR CIRCLE OF WILLIS WITHOUT CONTRAST
MRA OF THE NECK WITHOUT AND WITH CONTRAST
TECHNIQUE: Multiplanar, multiecho pulse sequences of the brain, circle of
willis and surrounding structures were obtained without intravenous
contrast. Angiographic images of the neck were obtained using MRA
technique without and with intravenous contrast.
CONTRAST:  8mL GADAVIST GADOBUTROL 1 MMOL/ML IV SOLN

[Series 28: tof_fl3d_tra_iso · axial · 0.6mm · 0.52mm/px · z∈[-181,-87]mm · 11 of 159 slices shown]
[im 1/159]
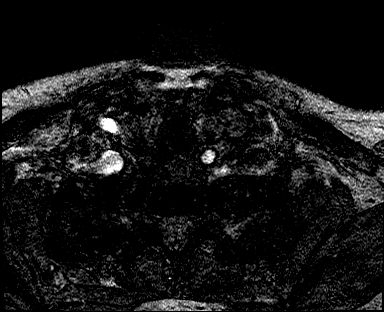
[im 16/159]
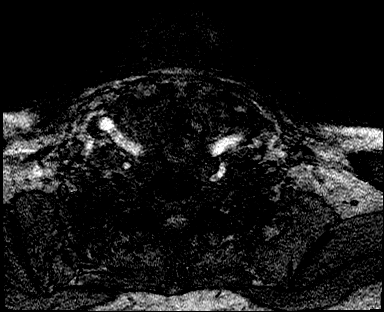
[im 32/159]
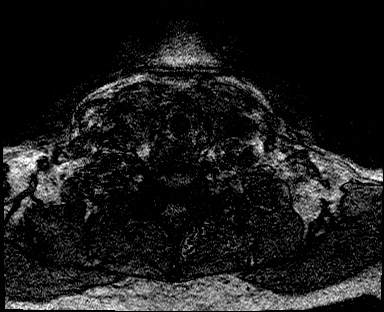
[im 48/159]
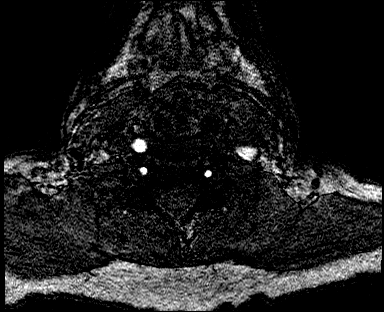
[im 64/159]
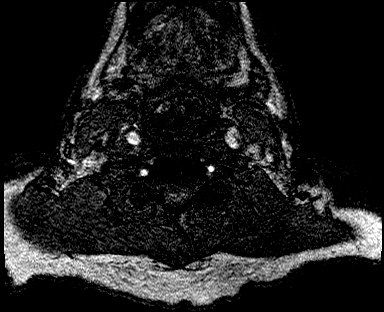
[im 80/159]
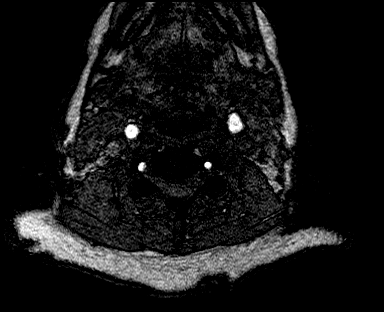
[im 95/159]
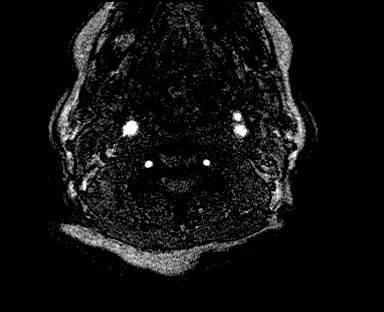
[im 111/159]
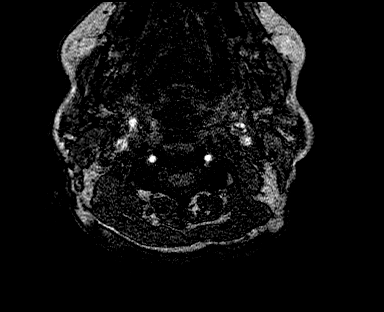
[im 127/159]
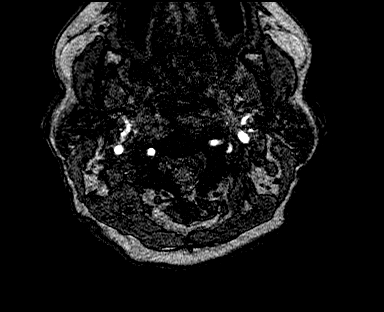
[im 143/159]
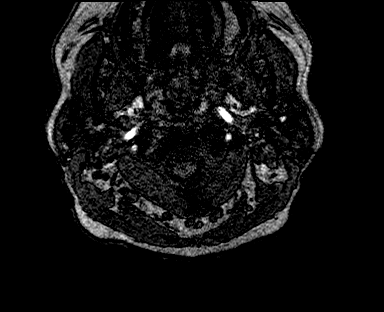
[im 159/159]
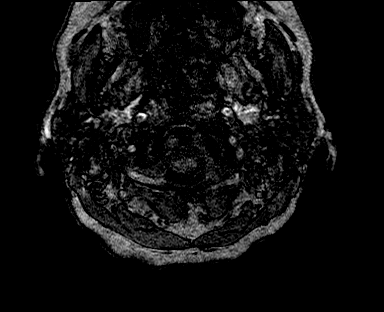

[Series 28: angio_fl3d_cor_pre_ttc=3.0s · coronal · 0.9mm · 0.85mm/px · 6 of 80 slices shown]
[im 1/80]
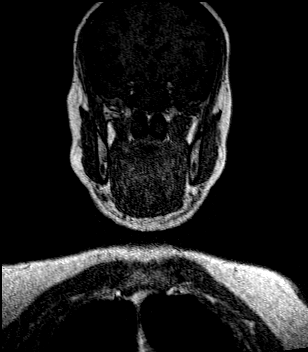
[im 16/80]
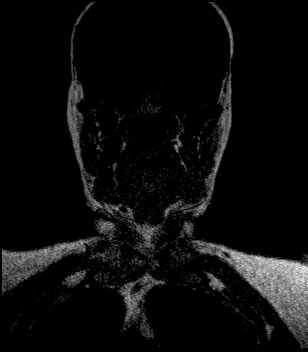
[im 32/80]
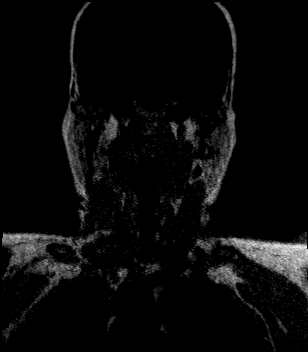
[im 48/80]
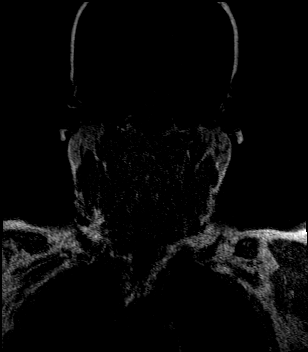
[im 64/80]
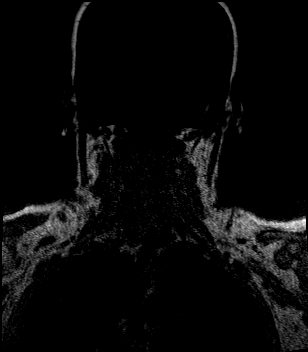
[im 80/80]
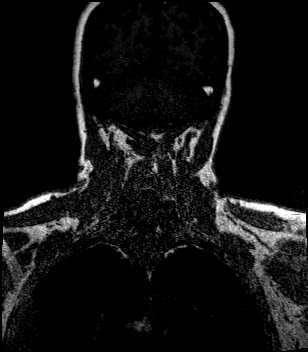

[Series 30: angio_fl3d_cor_post_ttc=3.0s · coronal · 0.9mm · 0.85mm/px · 5 of 80 slices shown (1 of 2)]
[im 1/80]
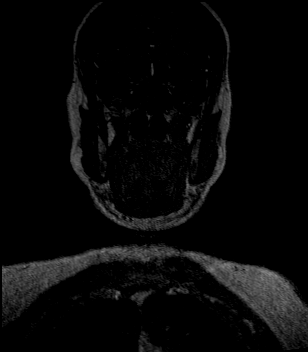
[im 20/80]
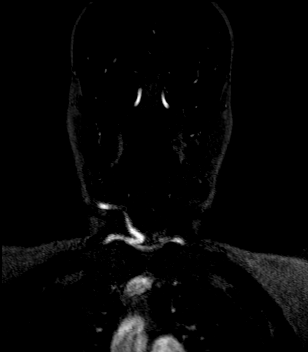
[im 40/80]
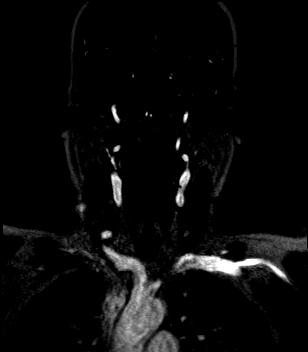
[im 60/80]
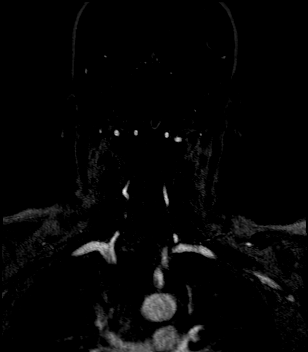
[im 80/80]
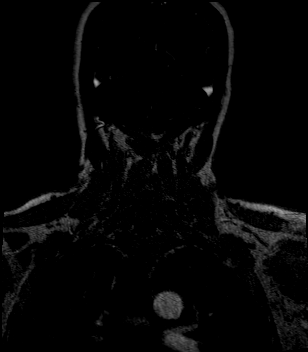

[Series 31: angio_fl3d_cor_post_ttc=3.0s_moco-adv · coronal · 0.9mm · 0.85mm/px · 5 of 80 slices shown (1 of 2)]
[im 1/80]
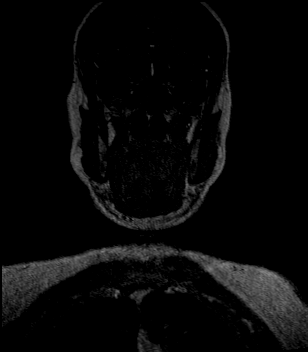
[im 20/80]
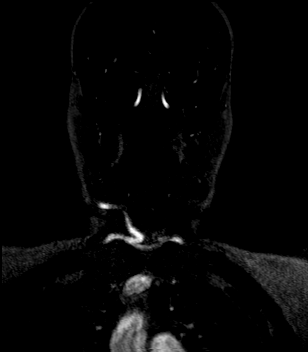
[im 40/80]
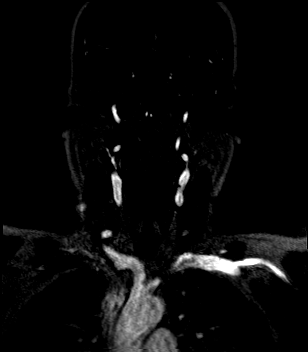
[im 60/80]
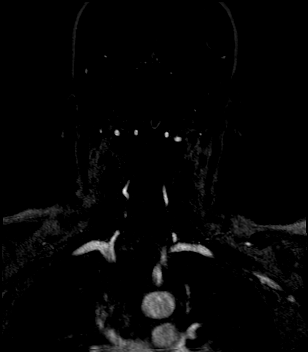
[im 80/80]
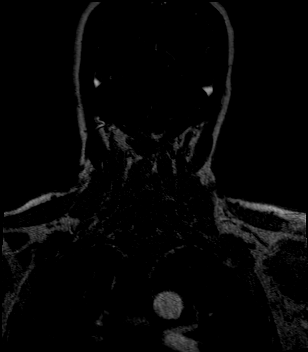

[Series 31: t1_se_fs_tra_blood-suppr. · axial · 2.0mm · 0.47mm/px · 1 of 12 slices shown]
[im 1/12]
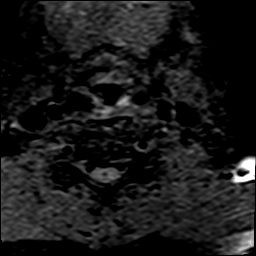

[Series 32: angio_fl3d_cor_post_ttc=3.0s_moco-adv_sub · coronal · 0.9mm · 0.85mm/px · 5 of 80 slices shown]
[im 1/80]
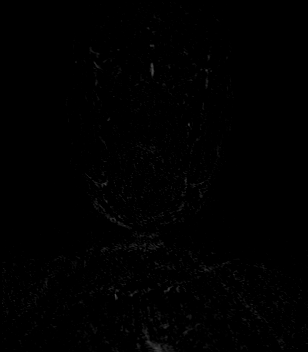
[im 20/80]
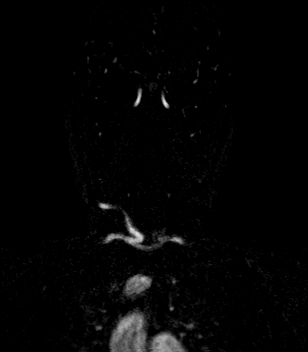
[im 40/80]
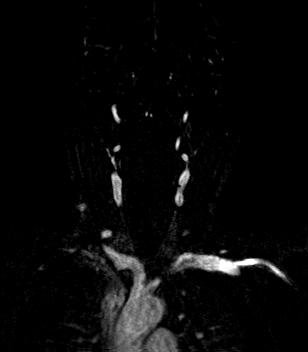
[im 60/80]
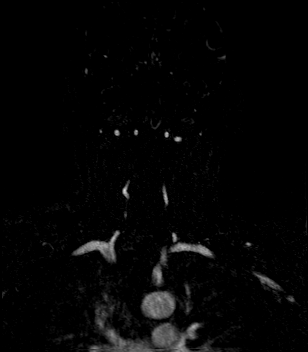
[im 80/80]
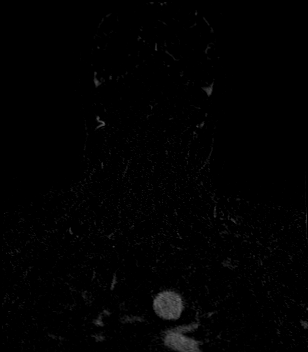

[Series 34: angio_fl3d_cor_post_ttc=3.0s · coronal · 0.9mm · 0.85mm/px · 5 of 80 slices shown (2 of 2)]
[im 1/80]
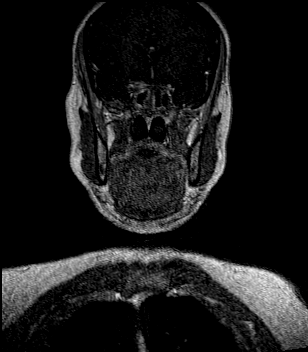
[im 20/80]
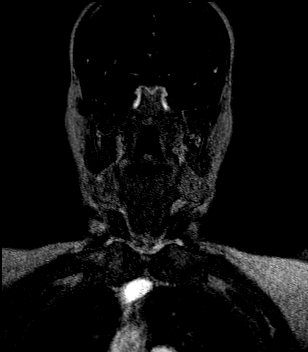
[im 40/80]
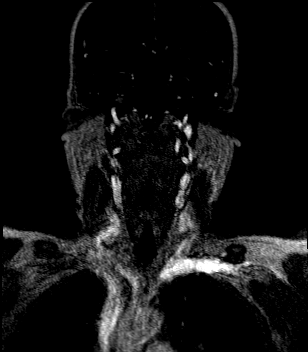
[im 60/80]
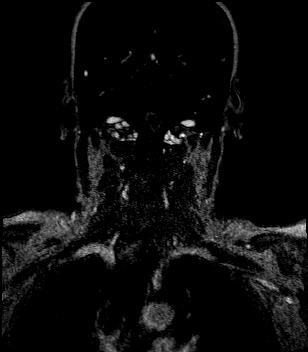
[im 80/80]
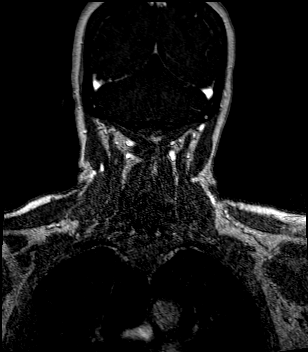

[Series 35: angio_fl3d_cor_post_ttc=3.0s_moco-adv · coronal · 0.9mm · 0.85mm/px · 3 of 80 slices shown (2 of 2)]
[im 1/80]
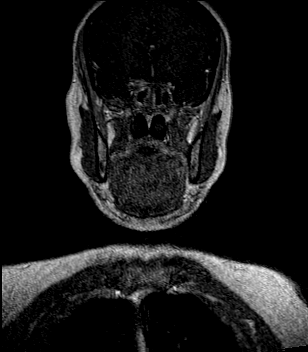
[im 20/80]
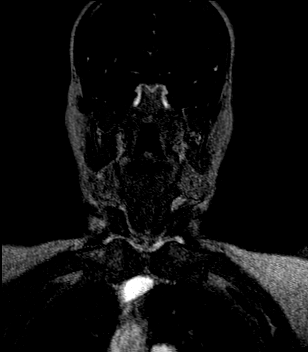
[im 40/80]
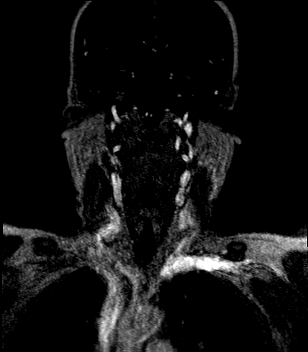

[41 of 48 positions shown; findings below may reference images not displayed]

FINDINGS: MR HEAD FINDINGS

Brain: There is no evidence of an acute infarct, intracranial
hemorrhage, mass, midline shift, or extra-axial fluid collection.
The ventricles and sulci are normal. Small foci of T2 hyperintensity
in the cerebral white matter bilaterally are nonspecific but
compatible with mild chronic small vessel ischemic disease. No
abnormal enhancement is identified.

Vascular: Major intracranial vascular flow voids are preserved.

Skull and upper cervical spine: Unremarkable bone marrow signal.

Sinuses/Orbits: Unremarkable orbits. Widespread mucosal thickening
in the paranasal sinuses with complete opacification of the right
frontal sinus and right sphenoid sinus and subtotal opacification of
the left sphenoid sinus. Bilateral maxillary sinus fluid. Clear
mastoid air cells.

Other: None.

MR CIRCLE OF WILLIS FINDINGS

The intracranial vertebral arteries are widely patent to the
basilar. The left PICA and right AICA appear dominant. Widely patent
SCA origins are seen bilaterally. The basilar artery is widely
patent. There are small posterior communicating arteries
bilaterally. Both PCAs are patent without evidence of a significant
proximal stenosis.

The internal carotid arteries are patent from skull base to carotid
termini without evidence of a significant stenosis. Artifact is
noted in the proximal petrous ICA segments bilaterally. ACAs and
MCAs are patent without evidence of a proximal branch occlusion or
significant proximal stenosis. No aneurysm is identified.

MRA NECK FINDINGS

The study is mildly motion degraded.

There is a standard 3 vessel aortic arch. The brachiocephalic and
subclavian arteries are widely patent.

The common carotid and cervical internal carotid arteries are patent
bilaterally without evidence of significant stenosis or dissection.
There is mild beading of the mid to distal right cervical ICA.

The vertebral arteries are patent and codominant with antegrade flow
bilaterally. No significant vertebral artery stenosis or dissection
is identified.
IMPRESSION: 1. No acute intracranial abnormality.
2. Mild chronic small vessel ischemic disease.
3. Pansinusitis.
4. Negative head MRA.
5. Widely patent cervical carotid and vertebral arteries.
6. Possible mild changes of fibromuscular dysplasia in the right
cervical ICA.

## 2020-03-15 MED ORDER — GADOBUTROL 1 MMOL/ML IV SOLN
8.0000 mL | Freq: Once | INTRAVENOUS | Status: AC | PRN
  Administered 2020-03-15: 8 mL via INTRAVENOUS

## 2020-03-15 NOTE — ED Triage Notes (Signed)
Pt reports loss of vision in her right eye that started yesterday. Pt seen at [facility] with full stroke workup, pt followed up with eye doctor today who sent her back here for further evaluation. Pt a.o, no neuro deficits noted.

## 2020-03-15 NOTE — ED Provider Notes (Signed)
I received a phone call about the patient from Dr. Allena Katz, ophthalmology.  He had seen the patient after being seen in the [facility] emergency department.  Since then she has had worsening visual acuity to the right eye to the point now she has difficulty appreciating light.  He sees retinal edema on his exam which is more consistent with an acute stroke.  He sent her to the emergency department for stroke evaluation.  As there is a prolonged wait in the emergency department I ordered an MRI MRA of the head and neck.   Melene Plan, DO 03/15/20 1323

## 2020-03-16 ENCOUNTER — Encounter (HOSPITAL_COMMUNITY): Payer: Self-pay | Admitting: Internal Medicine

## 2020-03-16 ENCOUNTER — Observation Stay (HOSPITAL_COMMUNITY): Payer: Medicare HMO

## 2020-03-16 DIAGNOSIS — J449 Chronic obstructive pulmonary disease, unspecified: Secondary | ICD-10-CM | POA: Diagnosis present

## 2020-03-16 DIAGNOSIS — H547 Unspecified visual loss: Secondary | ICD-10-CM | POA: Diagnosis present

## 2020-03-16 DIAGNOSIS — E669 Obesity, unspecified: Secondary | ICD-10-CM | POA: Diagnosis present

## 2020-03-16 DIAGNOSIS — Z87891 Personal history of nicotine dependence: Secondary | ICD-10-CM | POA: Diagnosis not present

## 2020-03-16 DIAGNOSIS — J0130 Acute sphenoidal sinusitis, unspecified: Secondary | ICD-10-CM | POA: Diagnosis present

## 2020-03-16 DIAGNOSIS — H5461 Unqualified visual loss, right eye, normal vision left eye: Secondary | ICD-10-CM

## 2020-03-16 DIAGNOSIS — H269 Unspecified cataract: Secondary | ICD-10-CM | POA: Diagnosis present

## 2020-03-16 DIAGNOSIS — I10 Essential (primary) hypertension: Secondary | ICD-10-CM | POA: Diagnosis present

## 2020-03-16 DIAGNOSIS — Z20822 Contact with and (suspected) exposure to covid-19: Secondary | ICD-10-CM | POA: Diagnosis present

## 2020-03-16 DIAGNOSIS — Z79899 Other long term (current) drug therapy: Secondary | ICD-10-CM | POA: Diagnosis not present

## 2020-03-16 DIAGNOSIS — Z6832 Body mass index (BMI) 32.0-32.9, adult: Secondary | ICD-10-CM | POA: Diagnosis not present

## 2020-03-16 DIAGNOSIS — K5900 Constipation, unspecified: Secondary | ICD-10-CM | POA: Diagnosis present

## 2020-03-16 DIAGNOSIS — R8271 Bacteriuria: Secondary | ICD-10-CM | POA: Diagnosis present

## 2020-03-16 DIAGNOSIS — H543 Unqualified visual loss, both eyes: Secondary | ICD-10-CM | POA: Diagnosis present

## 2020-03-16 DIAGNOSIS — H47091 Other disorders of optic nerve, not elsewhere classified, right eye: Secondary | ICD-10-CM | POA: Diagnosis present

## 2020-03-16 DIAGNOSIS — N390 Urinary tract infection, site not specified: Secondary | ICD-10-CM | POA: Diagnosis present

## 2020-03-16 DIAGNOSIS — R7303 Prediabetes: Secondary | ICD-10-CM | POA: Diagnosis present

## 2020-03-16 DIAGNOSIS — H4710 Unspecified papilledema: Secondary | ICD-10-CM | POA: Diagnosis present

## 2020-03-16 LAB — LIPID PANEL
Cholesterol: 200 mg/dL (ref 0–200)
HDL: 71 mg/dL (ref >40)
LDL Cholesterol: 111 mg/dL — ABNORMAL HIGH (ref 0–99)
Total CHOL/HDL Ratio: 2.8 RATIO
Triglycerides: 89 mg/dL (ref <150)
VLDL: 18 mg/dL (ref 0–40)

## 2020-03-16 LAB — SEDIMENTATION RATE: Sed Rate: 10 mm/hr (ref 0–22)

## 2020-03-16 LAB — SARS CORONAVIRUS 2 BY RT PCR (HOSPITAL ORDER, PERFORMED IN ~~LOC~~ HOSPITAL LAB): SARS Coronavirus 2: NEGATIVE

## 2020-03-16 LAB — C-REACTIVE PROTEIN: CRP: 1.9 mg/dL — ABNORMAL HIGH (ref <1.0)

## 2020-03-16 LAB — HEMOGLOBIN A1C
Hgb A1c MFr Bld: 5.8 % — ABNORMAL HIGH (ref 4.8–5.6)
Mean Plasma Glucose: 119.76 mg/dL

## 2020-03-16 IMAGING — MR MR HEAD W/ CM
9 of 14 series · 26 of 48 positions shown · IV contrast (gadavist)
Comparison: MRI head without with contrast [DATE]
COMPARISON: MRI head without with contrast [DATE]

Addendum:
CLINICAL DATA: Right eye vision loss

EXAM:
MRI HEAD WITH CONTRAST
MRI ORBITS WITHOUT AND WITH CONTRAST
TECHNIQUE: Multiplanar, multiecho pulse sequences of the brain and surrounding
structures were obtained with intravenous contrast. Multiplanar,
multiecho pulse sequences of the orbits and surrounding structures
were obtained including fat saturation techniques, before and after
intravenous contrast administration.
CONTRAST:  8mL GADAVIST GADOBUTROL 1 MMOL/ML IV SOLN

[Series 6: T1 · axial · non-contrast · 3.0mm · 0.37mm/px · z∈[-79,-46]mm · 2 of 18 slices shown]
[im 1/18]
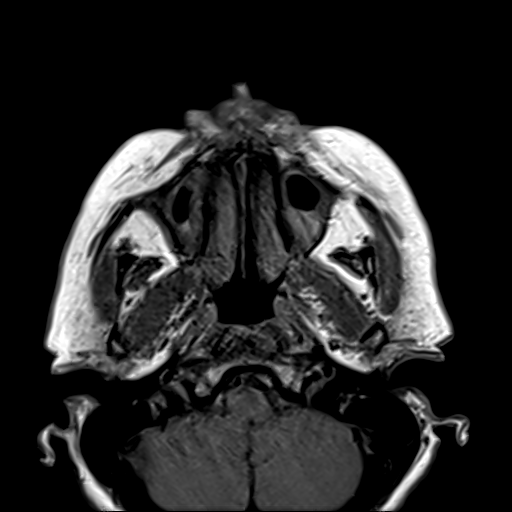
[im 9/18]
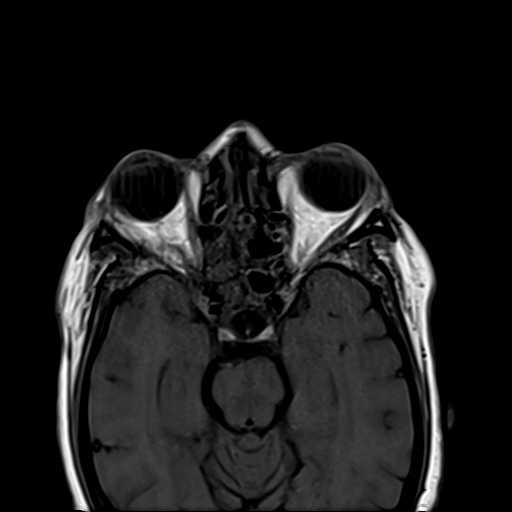

[Series 7: T2 fat-sat · axial · 3.0mm · 0.54mm/px · z∈[-79,-9]mm · 3 of 18 slices shown (1 of 6)]
[im 1/18]
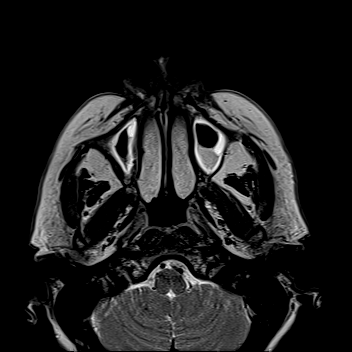
[im 9/18]
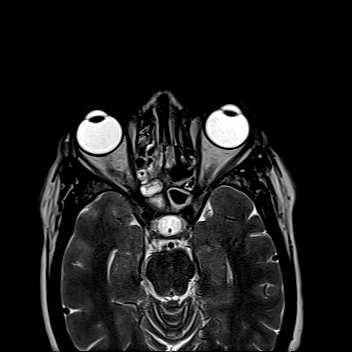
[im 18/18]
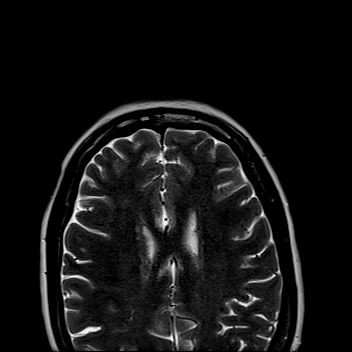

[Series 8: T2 fat-sat · axial · 3.0mm · 0.54mm/px · z∈[-79,-9]mm · 2 of 18 slices shown (2 of 6)]
[im 1/18]
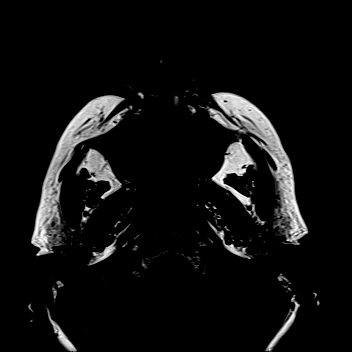
[im 18/18]
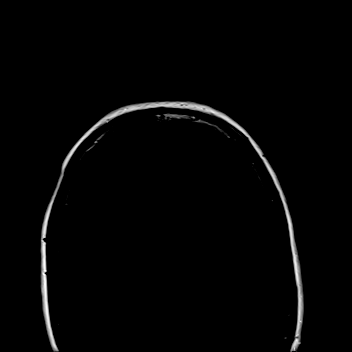

[Series 9: T2 fat-sat · axial · 3.0mm · 0.54mm/px · z∈[-79,-9]mm · 2 of 18 slices shown (3 of 6)]
[im 1/18]
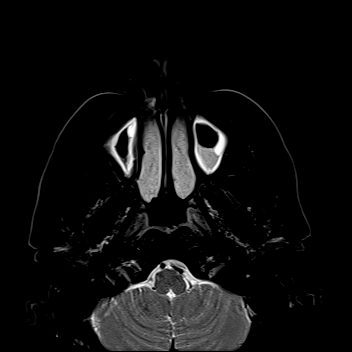
[im 18/18]
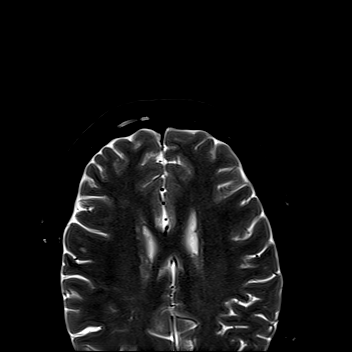

[Series 10: T2 fat-sat · coronal · 3.0mm · 0.54mm/px · 3 of 25 slices shown (4 of 6)]
[im 1/25]
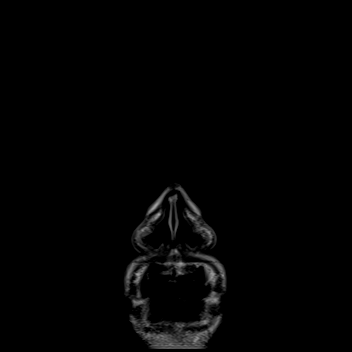
[im 13/25]
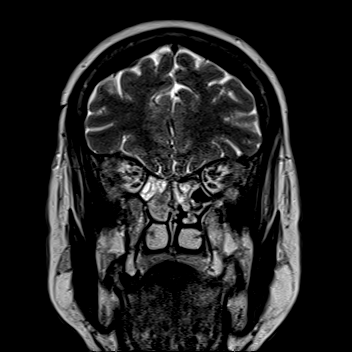
[im 25/25]
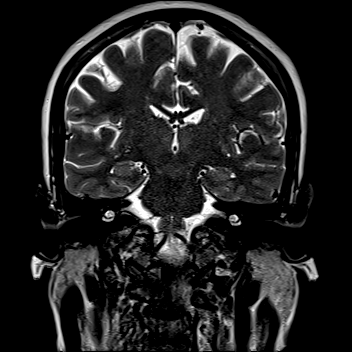

[Series 11: T2 fat-sat · coronal · 3.0mm · 0.54mm/px · 3 of 25 slices shown (5 of 6)]
[im 1/25]
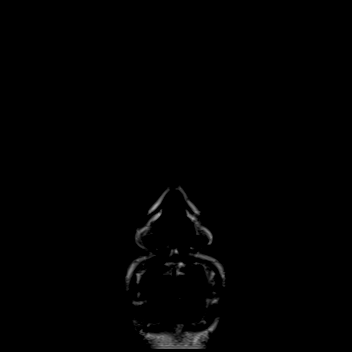
[im 13/25]
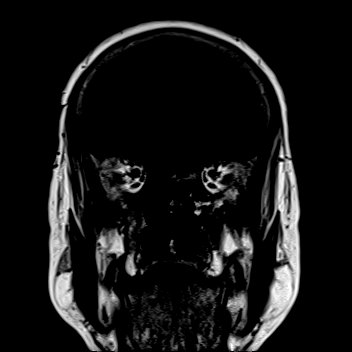
[im 25/25]
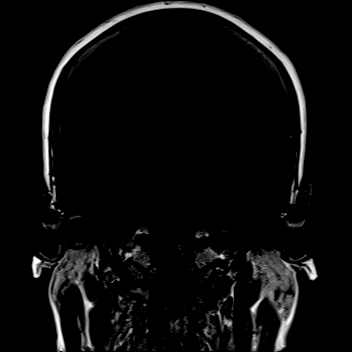

[Series 12: T2 fat-sat · coronal · 3.0mm · 0.54mm/px · 3 of 25 slices shown (6 of 6)]
[im 1/25]
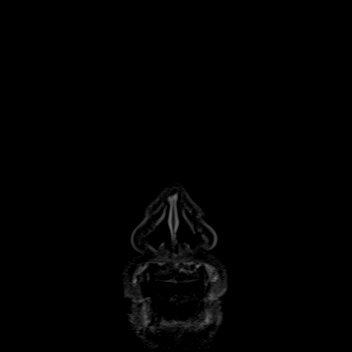
[im 13/25]
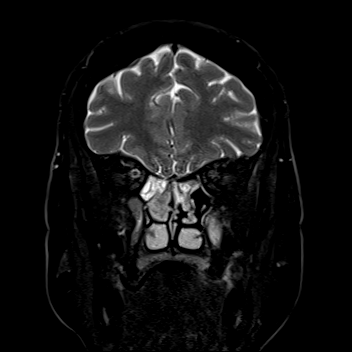
[im 25/25]
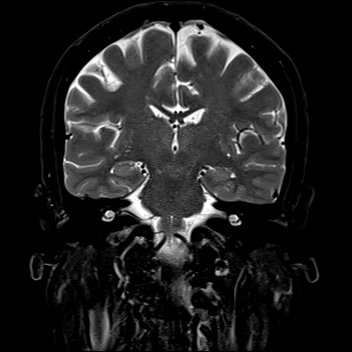

[Series 14: T2 post-contrast · coronal · 5.0mm · 0.72mm/px · 4 of 28 slices shown]
[im 1/28]
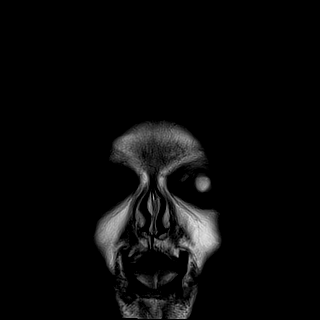
[im 10/28]
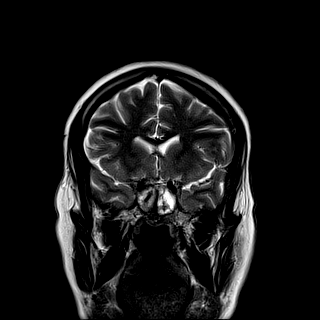
[im 19/28]
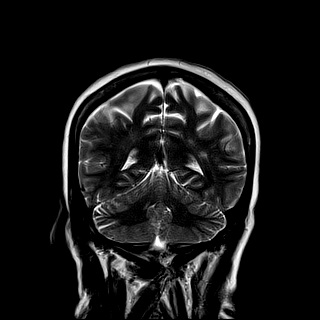
[im 28/28]
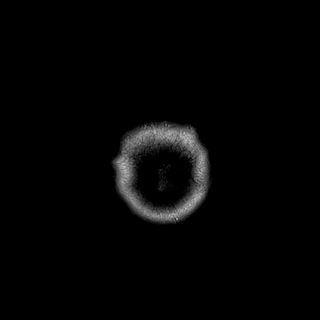

[Series 18: T1 post-contrast · coronal · 5.0mm · 0.34mm/px · 4 of 29 slices shown]
[im 1/29]
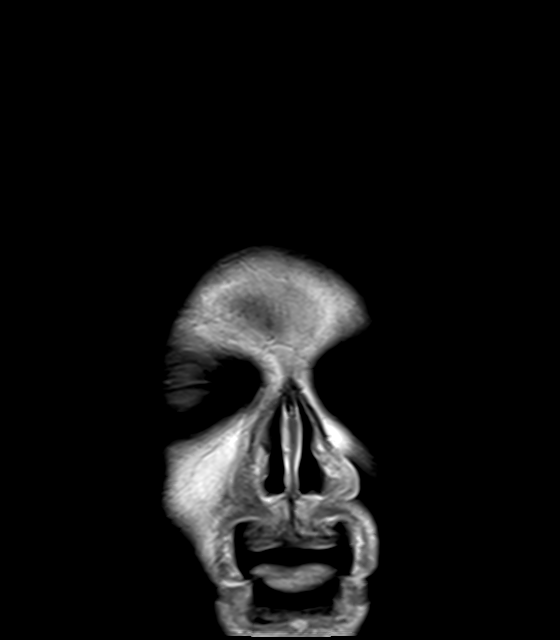
[im 10/29]
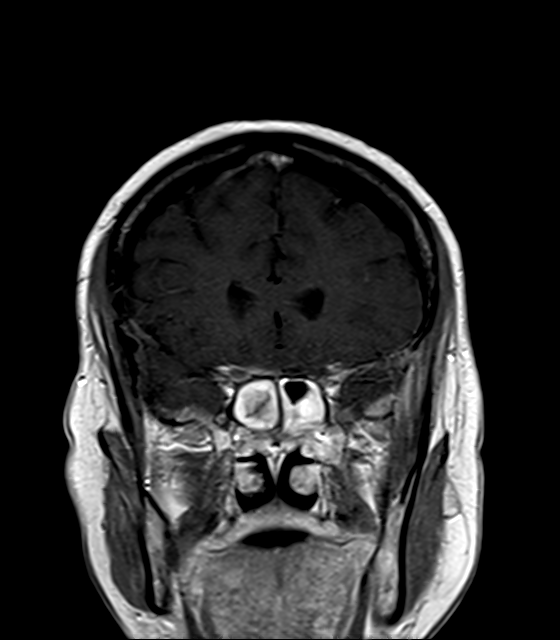
[im 19/29]
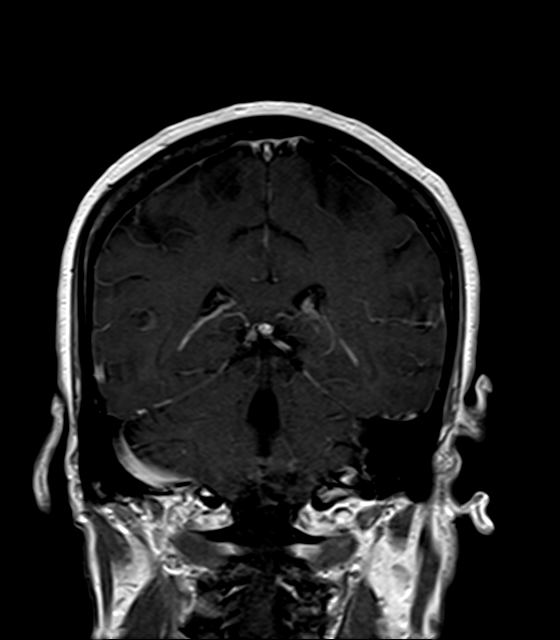
[im 29/29]
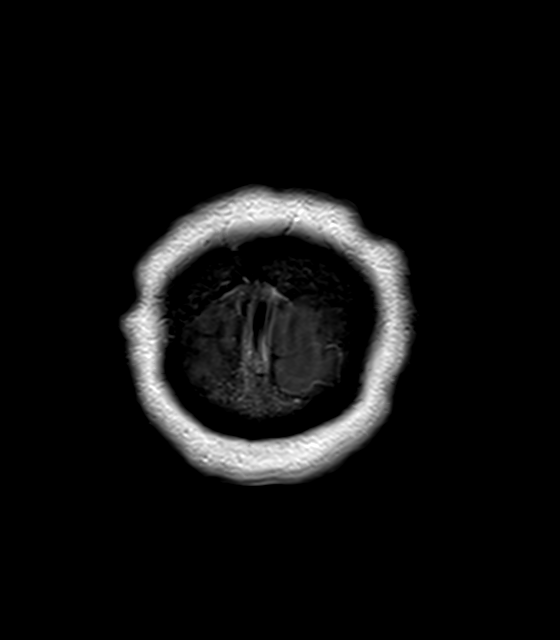

[26 of 48 positions shown; findings below may reference images not displayed]

FINDINGS: MRI HEAD FINDINGS

There is mild dural thickening and enhancement along the planum
sphenoidale on the right. This is unchanged from yesterday.
Remainder of the enhancement of the brain and surrounding structures
is normal.

There is extensive associated sinus mucosal disease throughout the
paranasal sinuses including the sphenoid sinus bilaterally. See
additional discussion below.

Pituitary enhances normally. Infundibulum midline. No enhancing
skeletal lesion.

MRI ORBITS FINDINGS

Orbits: Globe is symmetric and normal bilaterally. No orbital mass
or edema.

There is dural thickening and enhancement along the planum
sphenoidale on the right. See axial postcontrast image 10 and
coronal postcontrast image 9. This is in the region of the optic
canal. There appears to be some swelling of the optic nerve within
the optic canal with surrounding enhancement, series 18, image 20.
There is extensive mucosal edema in the adjacent sphenoid sinus and
this may represent inflammatory or infectious process. Remainder of
the optic nerves are normal.

Pituitary normal.  Cavernous sinus normal.

Visualized sinuses: Extensive mucosal edema throughout the paranasal
sinuses. Air-fluid levels in the maxillary sinus bilaterally.

Soft tissues: No soft tissue mass or edema.
IMPRESSION: Dural thickening along the planum sphenoidale on the right. There
appears to be edema in the right optic nerve extending through the
optic canal with surrounding enhancement suggestive of inflammatory
or infectious process likely related to sinusitis. Tumor not likely.
No enhancement of the adjacent brain. No brain abscess.

ADDENDUM:
These results were called by telephone at the time of interpretation
on [DATE] at [DATE] to provider MOLLEN, who verbally acknowledged
these results.

*** End of Addendum ***
FINDINGS: MRI HEAD FINDINGS

There is mild dural thickening and enhancement along the planum
sphenoidale on the right. This is unchanged from yesterday.
Remainder of the enhancement of the brain and surrounding structures
is normal.

There is extensive associated sinus mucosal disease throughout the
paranasal sinuses including the sphenoid sinus bilaterally. See
additional discussion below.

Pituitary enhances normally. Infundibulum midline. No enhancing
skeletal lesion.

MRI ORBITS FINDINGS

Orbits: Globe is symmetric and normal bilaterally. No orbital mass
or edema.

There is dural thickening and enhancement along the planum
sphenoidale on the right. See axial postcontrast image 10 and
coronal postcontrast image 9. This is in the region of the optic
canal. There appears to be some swelling of the optic nerve within
the optic canal with surrounding enhancement, series 18, image 20.
There is extensive mucosal edema in the adjacent sphenoid sinus and
this may represent inflammatory or infectious process. Remainder of
the optic nerves are normal.

Pituitary normal.  Cavernous sinus normal.

Visualized sinuses: Extensive mucosal edema throughout the paranasal
sinuses. Air-fluid levels in the maxillary sinus bilaterally.

Soft tissues: No soft tissue mass or edema.
IMPRESSION: Dural thickening along the planum sphenoidale on the right. There
appears to be edema in the right optic nerve extending through the
optic canal with surrounding enhancement suggestive of inflammatory
or infectious process likely related to sinusitis. Tumor not likely.
No enhancement of the adjacent brain. No brain abscess.

## 2020-03-16 IMAGING — MR MR ORBITS WO/W CM
9 of 14 series · 26 of 48 positions shown · IV contrast (gadavist)
Comparison: MRI head without with contrast [DATE]
COMPARISON: MRI head without with contrast [DATE]

Addendum:
CLINICAL DATA: Right eye vision loss

EXAM:
MRI HEAD WITH CONTRAST
MRI ORBITS WITHOUT AND WITH CONTRAST
TECHNIQUE: Multiplanar, multiecho pulse sequences of the brain and surrounding
structures were obtained with intravenous contrast. Multiplanar,
multiecho pulse sequences of the orbits and surrounding structures
were obtained including fat saturation techniques, before and after
intravenous contrast administration.
CONTRAST:  8mL GADAVIST GADOBUTROL 1 MMOL/ML IV SOLN

[Series 6: T1 · axial · non-contrast · 3.0mm · 0.37mm/px · z∈[-79,-46]mm · 2 of 18 slices shown]
[im 1/18]
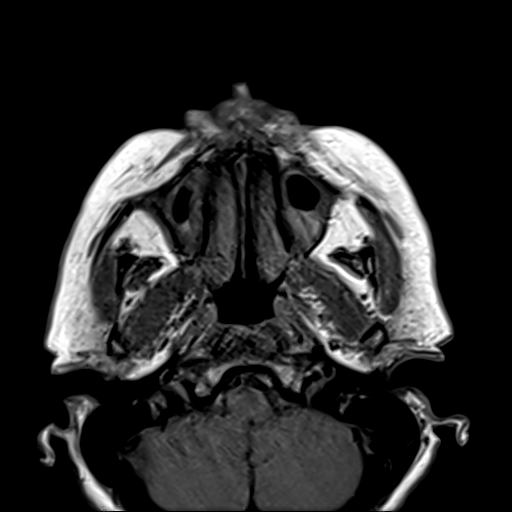
[im 9/18]
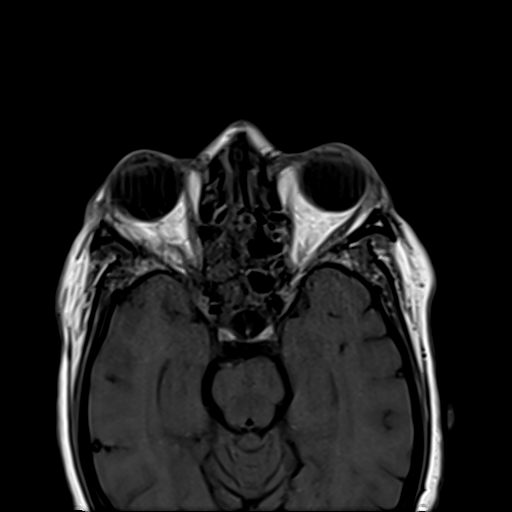

[Series 7: T2 fat-sat · axial · 3.0mm · 0.54mm/px · z∈[-79,-9]mm · 3 of 18 slices shown (1 of 6)]
[im 1/18]
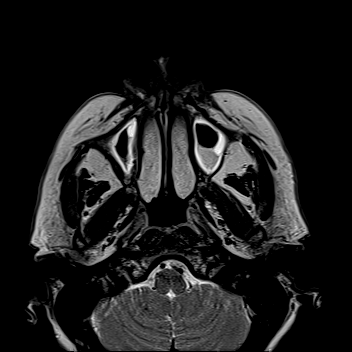
[im 9/18]
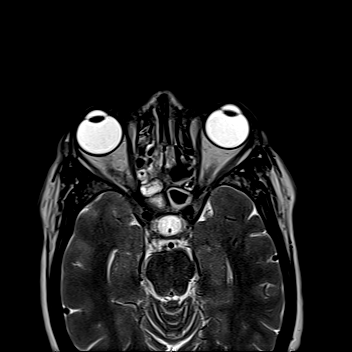
[im 18/18]
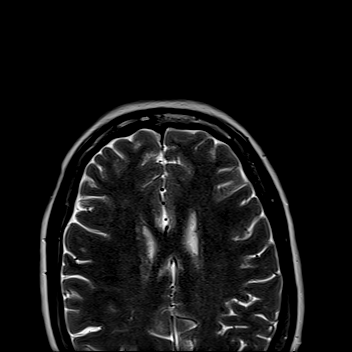

[Series 8: T2 fat-sat · axial · 3.0mm · 0.54mm/px · z∈[-79,-9]mm · 2 of 18 slices shown (2 of 6)]
[im 1/18]
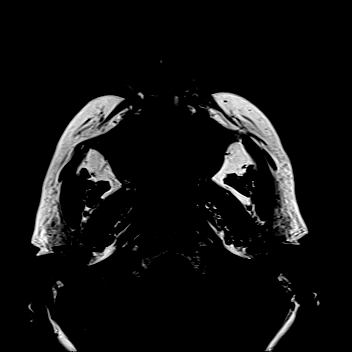
[im 18/18]
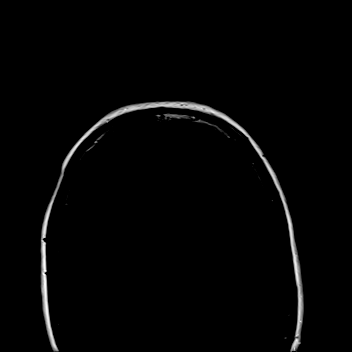

[Series 9: T2 fat-sat · axial · 3.0mm · 0.54mm/px · z∈[-79,-9]mm · 2 of 18 slices shown (3 of 6)]
[im 1/18]
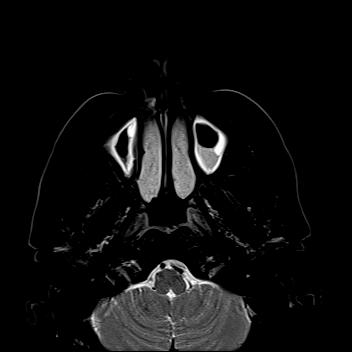
[im 18/18]
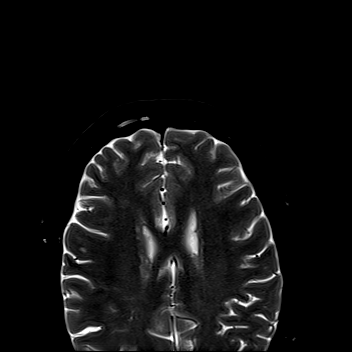

[Series 10: T2 fat-sat · coronal · 3.0mm · 0.54mm/px · 3 of 25 slices shown (4 of 6)]
[im 1/25]
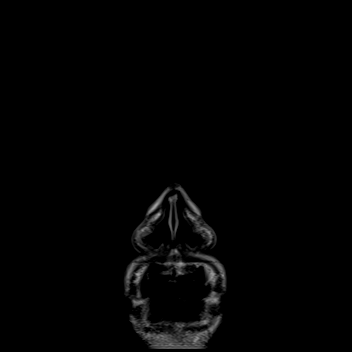
[im 13/25]
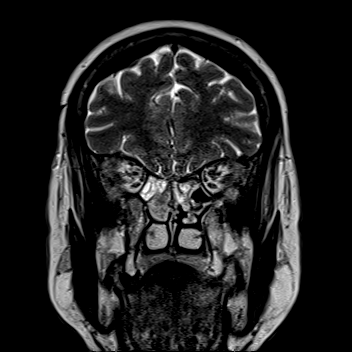
[im 25/25]
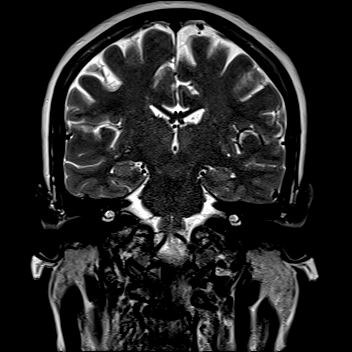

[Series 11: T2 fat-sat · coronal · 3.0mm · 0.54mm/px · 3 of 25 slices shown (5 of 6)]
[im 1/25]
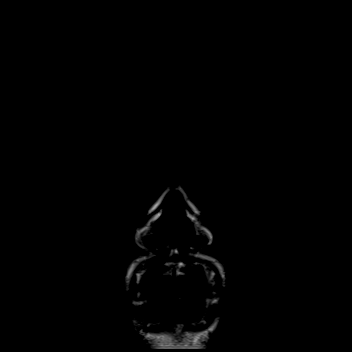
[im 13/25]
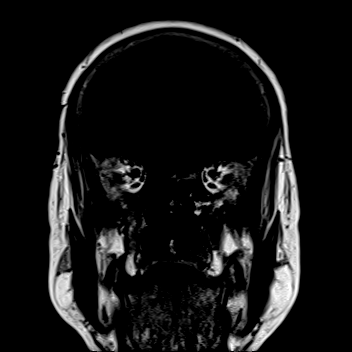
[im 25/25]
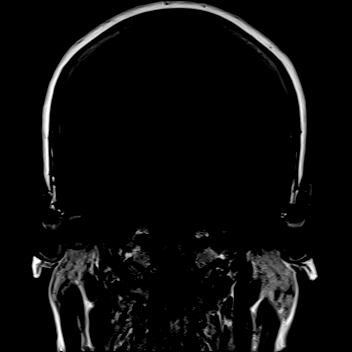

[Series 12: T2 fat-sat · coronal · 3.0mm · 0.54mm/px · 3 of 25 slices shown (6 of 6)]
[im 1/25]
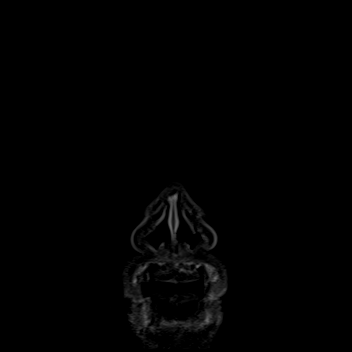
[im 13/25]
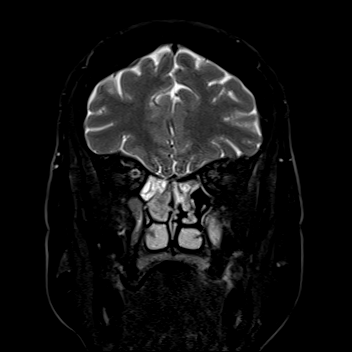
[im 25/25]
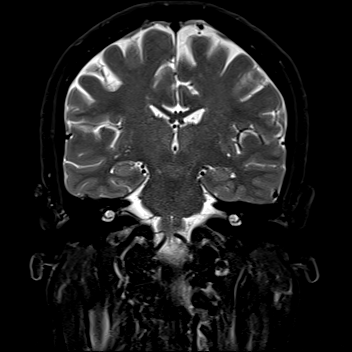

[Series 14: T2 post-contrast · coronal · 5.0mm · 0.72mm/px · 4 of 28 slices shown]
[im 1/28]
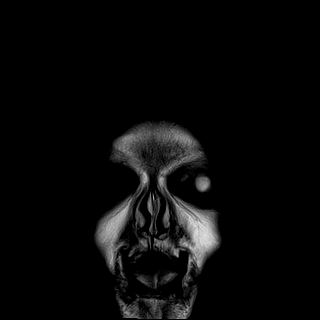
[im 10/28]
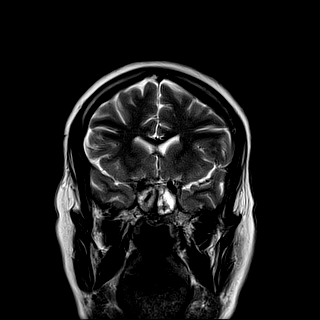
[im 19/28]
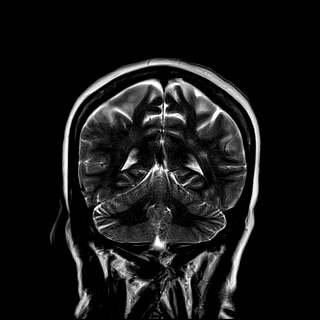
[im 28/28]
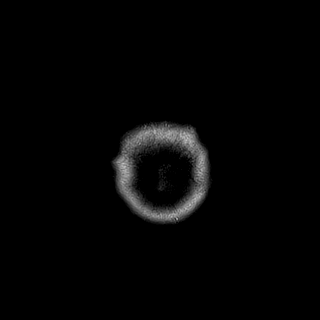

[Series 18: T1 post-contrast · coronal · 5.0mm · 0.34mm/px · 4 of 29 slices shown]
[im 1/29]
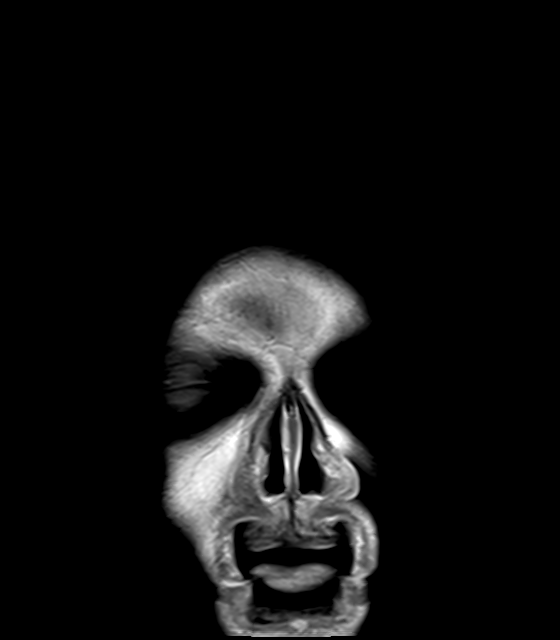
[im 10/29]
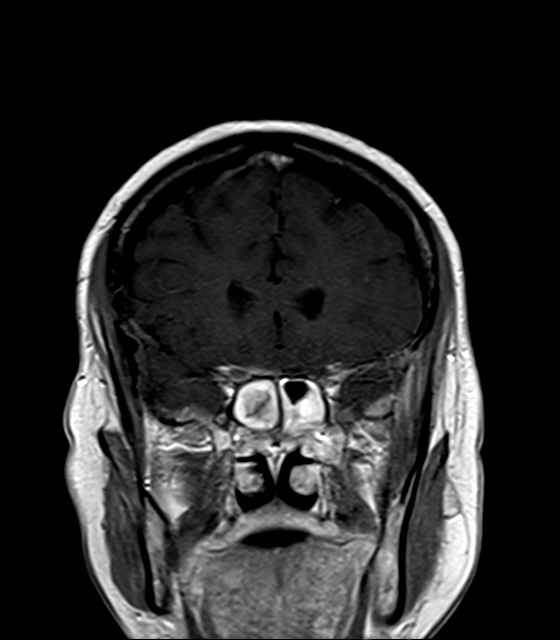
[im 19/29]
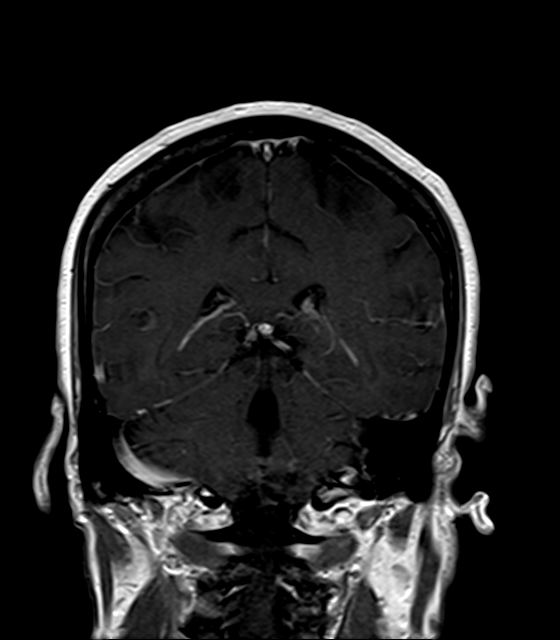
[im 29/29]
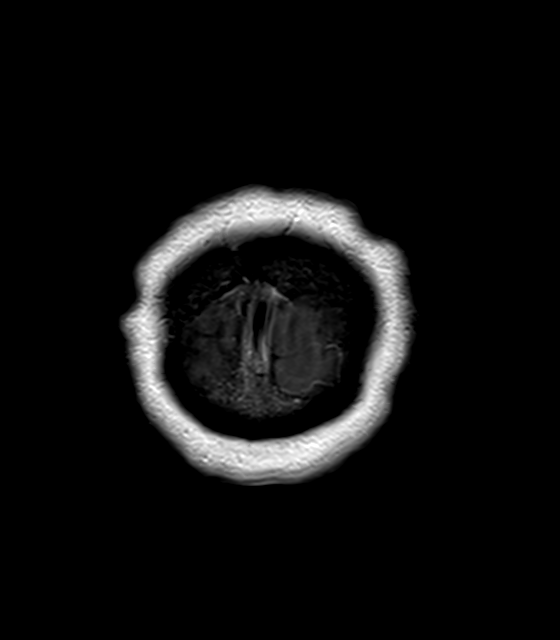

[26 of 48 positions shown; findings below may reference images not displayed]

FINDINGS: MRI HEAD FINDINGS

There is mild dural thickening and enhancement along the planum
sphenoidale on the right. This is unchanged from yesterday.
Remainder of the enhancement of the brain and surrounding structures
is normal.

There is extensive associated sinus mucosal disease throughout the
paranasal sinuses including the sphenoid sinus bilaterally. See
additional discussion below.

Pituitary enhances normally. Infundibulum midline. No enhancing
skeletal lesion.

MRI ORBITS FINDINGS

Orbits: Globe is symmetric and normal bilaterally. No orbital mass
or edema.

There is dural thickening and enhancement along the planum
sphenoidale on the right. See axial postcontrast image 10 and
coronal postcontrast image 9. This is in the region of the optic
canal. There appears to be some swelling of the optic nerve within
the optic canal with surrounding enhancement, series 18, image 20.
There is extensive mucosal edema in the adjacent sphenoid sinus and
this may represent inflammatory or infectious process. Remainder of
the optic nerves are normal.

Pituitary normal.  Cavernous sinus normal.

Visualized sinuses: Extensive mucosal edema throughout the paranasal
sinuses. Air-fluid levels in the maxillary sinus bilaterally.

Soft tissues: No soft tissue mass or edema.
IMPRESSION: Dural thickening along the planum sphenoidale on the right. There
appears to be edema in the right optic nerve extending through the
optic canal with surrounding enhancement suggestive of inflammatory
or infectious process likely related to sinusitis. Tumor not likely.
No enhancement of the adjacent brain. No brain abscess.

ADDENDUM:
These results were called by telephone at the time of interpretation
on [DATE] at [DATE] to provider MOLLEN, who verbally acknowledged
these results.

*** End of Addendum ***
FINDINGS: MRI HEAD FINDINGS

There is mild dural thickening and enhancement along the planum
sphenoidale on the right. This is unchanged from yesterday.
Remainder of the enhancement of the brain and surrounding structures
is normal.

There is extensive associated sinus mucosal disease throughout the
paranasal sinuses including the sphenoid sinus bilaterally. See
additional discussion below.

Pituitary enhances normally. Infundibulum midline. No enhancing
skeletal lesion.

MRI ORBITS FINDINGS

Orbits: Globe is symmetric and normal bilaterally. No orbital mass
or edema.

There is dural thickening and enhancement along the planum
sphenoidale on the right. See axial postcontrast image 10 and
coronal postcontrast image 9. This is in the region of the optic
canal. There appears to be some swelling of the optic nerve within
the optic canal with surrounding enhancement, series 18, image 20.
There is extensive mucosal edema in the adjacent sphenoid sinus and
this may represent inflammatory or infectious process. Remainder of
the optic nerves are normal.

Pituitary normal.  Cavernous sinus normal.

Visualized sinuses: Extensive mucosal edema throughout the paranasal
sinuses. Air-fluid levels in the maxillary sinus bilaterally.

Soft tissues: No soft tissue mass or edema.
IMPRESSION: Dural thickening along the planum sphenoidale on the right. There
appears to be edema in the right optic nerve extending through the
optic canal with surrounding enhancement suggestive of inflammatory
or infectious process likely related to sinusitis. Tumor not likely.
No enhancement of the adjacent brain. No brain abscess.

## 2020-03-16 MED ORDER — SODIUM CHLORIDE 0.9 % IV SOLN
1000.0000 mg | INTRAVENOUS | Status: DC
  Administered 2020-03-16 – 2020-03-18 (×3): 1000 mg via INTRAVENOUS
  Filled 2020-03-16 (×3): qty 8

## 2020-03-16 MED ORDER — ALPRAZOLAM 0.5 MG PO TABS: 0.5000 mg | ORAL_TABLET | Freq: Two times a day (BID) | ORAL | Status: DC | PRN

## 2020-03-16 MED ORDER — FLUTICASONE PROPIONATE 50 MCG/ACT NA SUSP
1.0000 | Freq: Every day | NASAL | Status: DC
  Administered 2020-03-16 – 2020-03-18 (×3): 1 via NASAL
  Filled 2020-03-16: qty 16

## 2020-03-16 MED ORDER — SODIUM CHLORIDE 0.9 % IV SOLN
3.0000 g | Freq: Four times a day (QID) | INTRAVENOUS | Status: DC
  Administered 2020-03-16 – 2020-03-18 (×8): 3 g via INTRAVENOUS
  Filled 2020-03-16 (×10): qty 8

## 2020-03-16 MED ORDER — ACETAMINOPHEN 160 MG/5ML PO SOLN: 650.0000 mg | ORAL | Status: DC | PRN

## 2020-03-16 MED ORDER — ACETAMINOPHEN 650 MG RE SUPP: 650.0000 mg | RECTAL | Status: DC | PRN

## 2020-03-16 MED ORDER — ALBUTEROL SULFATE (2.5 MG/3ML) 0.083% IN NEBU: 2.5000 mg | INHALATION_SOLUTION | Freq: Four times a day (QID) | RESPIRATORY_TRACT | Status: DC | PRN

## 2020-03-16 MED ORDER — SODIUM CHLORIDE 0.9 % IV SOLN
1.0000 g | Freq: Every day | INTRAVENOUS | Status: DC
  Administered 2020-03-16: 1 g via INTRAVENOUS
  Filled 2020-03-16: qty 10

## 2020-03-16 MED ORDER — GADOBUTROL 1 MMOL/ML IV SOLN: 7.0000 mL | Freq: Once | INTRAVENOUS | Status: DC | PRN

## 2020-03-16 MED ORDER — SODIUM CHLORIDE 0.9 % IV SOLN
1000.0000 mg | Freq: Once | INTRAVENOUS | Status: DC
  Filled 2020-03-16: qty 8

## 2020-03-16 MED ORDER — SODIUM CHLORIDE 0.9 % IV SOLN: INTRAVENOUS | Status: AC

## 2020-03-16 MED ORDER — ASPIRIN 81 MG PO CHEW
81.0000 mg | CHEWABLE_TABLET | Freq: Every day | ORAL | Status: DC
  Administered 2020-03-16 – 2020-03-18 (×3): 81 mg via ORAL
  Filled 2020-03-16 (×3): qty 1

## 2020-03-16 MED ORDER — LORAZEPAM 2 MG/ML IJ SOLN
1.0000 mg | Freq: Once | INTRAMUSCULAR | Status: AC | PRN
  Administered 2020-03-16: 1 mg via INTRAVENOUS
  Filled 2020-03-16: qty 1

## 2020-03-16 MED ORDER — SENNOSIDES-DOCUSATE SODIUM 8.6-50 MG PO TABS
1.0000 | ORAL_TABLET | Freq: Two times a day (BID) | ORAL | Status: DC
  Administered 2020-03-16 – 2020-03-18 (×4): 1 via ORAL
  Filled 2020-03-16 (×4): qty 1

## 2020-03-16 MED ORDER — STROKE: EARLY STAGES OF RECOVERY BOOK: Freq: Once | Status: AC

## 2020-03-16 MED ORDER — LOSARTAN POTASSIUM 25 MG PO TABS
25.0000 mg | ORAL_TABLET | Freq: Every day | ORAL | Status: DC
  Administered 2020-03-16 – 2020-03-18 (×3): 25 mg via ORAL
  Filled 2020-03-16 (×3): qty 1

## 2020-03-16 MED ORDER — ACETAMINOPHEN 325 MG PO TABS: 650.0000 mg | ORAL_TABLET | ORAL | Status: DC | PRN

## 2020-03-16 MED ORDER — POLYETHYLENE GLYCOL 3350 17 G PO PACK
17.0000 g | PACK | Freq: Every day | ORAL | Status: DC | PRN
  Administered 2020-03-16: 17 g via ORAL
  Filled 2020-03-16: qty 1

## 2020-03-16 NOTE — H&P (Signed)
History and Physical    Deborah Marsh IWP:[id number] DOB: 28-Jun-1951 DOA: 03/15/2020  PCP: Deborah Rio, MD  Patient coming from: Home.  Chief Complaint: Right eye vision loss.  HPI: Deborah Marsh is a 69 y.o. female with history of hypertension, COPD, prediabetes previous history of tobacco abuse quit 4 years ago recently treated for COPD exacerbation with steroids and antibiotics presents to the ER because of poor vision in the right eye.  Patient symptoms started on October 14 with mild blurriness of the vision with no eye pain or tearing.  Patient has history of left eye central vision loss since age 50.  Patient's right blurry vision gradually worsened and presented to the ER at [facility] on August 15th 2021 CT head was done which was unremarkable and was referred to ophthalmologist.  Patient went to the ophthalmologist on March 15, 2020 and was advised to come to the ER for further work-up as per the report from the ER physician ophthalmologist did not see any cause for the right eye vision loss.  By now patient has complete loss of vision on the right eye.  Denies any weakness of the upper or lower extremities.  ED Course: In the ER patient had MRI of the brain and MR angiogram of the head and neck which did not show anything acute.  Labs are significant for possible UTI.  Urine drug screen is positive for benzodiazepine.  Patient was seen by neurologist on-call and recommended getting MRI brain with and without contrast and MRI orbits with and without contrast.  Among others many other blood test has been ordered to rule out infectious cause and inflammatory causes.  Review of Systems: As per HPI, rest all negative.   Past Medical History:  Diagnosis Date  . COPD (chronic obstructive pulmonary disease) (Deborah Marsh)   . Hypertension     History reviewed. No pertinent surgical history.   reports that she has quit smoking. She has never used smokeless tobacco.  She reports that she does not drink alcohol and does not use drugs.  No Known Allergies  Family History  Problem Relation Age of Onset  . Glaucoma Mother     Prior to Admission medications   Medication Sig Start Date End Date Taking? Authorizing Provider  albuterol (PROVENTIL) (2.5 MG/3ML) 0.083% nebulizer solution Take 2.5 mg by nebulization every 6 (six) hours as needed for wheezing or shortness of breath.  03/01/20  Yes [provider]  albuterol (VENTOLIN HFA) 108 (90 Base) MCG/ACT inhaler Inhale 2 puffs into the lungs every 6 (six) hours as needed for wheezing or shortness of breath.   Yes [provider]  ALPRAZolam Duanne Moron) 0.5 MG tablet Take 0.5 mg by mouth 2 (two) times daily as needed for anxiety.   Yes [provider]  APPLE CIDER VINEGAR PO Take 1 tablet by mouth daily.   Yes [provider]  Cholecalciferol (VITAMIN D-3 PO) Take 1 capsule by mouth daily.   Yes [provider]  Cinnamon 500 MG capsule Take 500 mg by mouth daily.   Yes [provider]  fluticasone (FLONASE) 50 MCG/ACT nasal spray Place 1 spray into both nostrils daily.   Yes [provider]  GARLIC PO Take 1 tablet by mouth daily.   Yes [provider]  ketoconazole (NIZORAL) 2 % cream Apply 1 application topically 2 (two) times daily as needed for irritation.   Yes [provider]  losartan (COZAAR) 25 MG tablet  Take 25 mg by mouth daily. 01/29/20  Yes [provider]  traMADol (ULTRAM) 50 MG tablet Take 50 mg by mouth 2 (two) times daily as needed for moderate pain.   Yes [provider]  triamterene-hydrochlorothiazide (MAXZIDE-25) 37.5-25 MG tablet Take 1 tablet by mouth daily.   Yes [provider]    Physical Exam: Constitutional: Moderately built and nourished. Vitals:   03/15/20 1222 03/15/20 1601 03/15/20 1953 03/16/20 0420  BP: 132/83 140/84 139/78 139/90  Pulse: (!) 47 (!) 54 (!) 47 60  Resp: '16  14 16 18  ' Temp:  97.9 F (36.6 C) 98 F (36.7 C) 98.3 F (36.8 C)  TempSrc:  Oral Oral Oral  SpO2: 96% 97% 98% 98%   Eyes: Anicteric no pallor.  Patient not able to see anything for the right eye. ENMT: No discharge from the ears eyes nose or mouth. Neck: No mass felt.  No neck rigidity. Respiratory: No rhonchi or crepitations. Cardiovascular: S1-S2 heard. Abdomen: Soft nontender bowel sounds present. Musculoskeletal: No edema. Skin: No rash. Neurologic: Alert awake oriented to time place and person.  Moves all extremities 5 x 5. Psychiatric: Appears normal.  Normal affect.   Labs on Admission: I have personally reviewed following labs and imaging studies  CBC: Recent Labs  Lab 03/14/20 1706 03/15/20 1325  WBC 9.0 9.1  NEUTROABS 5.7 6.5  HGB 14.3 14.9  HCT 42.5 45.3  MCV 87.6 86.9  PLT 267 712   Basic Metabolic Panel: Recent Labs  Lab 03/14/20 1706 03/15/20 1325  NA 139 140  K 3.1* 3.6  CL 101 100  CO2 27 30  GLUCOSE 108* 95  BUN 14 11  CREATININE 1.05* 0.92  CALCIUM 9.6 9.8   GFR: Estimated Creatinine Clearance: 59.1 mL/min (by C-G formula based on SCr of 0.92 mg/dL). Liver Function Tests: Recent Labs  Lab 03/14/20 1706 03/15/20 1325  AST 17 14*  ALT 16 16  ALKPHOS 39 40  BILITOT 0.8 1.0  PROT 7.7 7.6  ALBUMIN 4.2 4.1   No results for input(s): LIPASE, AMYLASE in the last 168 hours. No results for input(s): AMMONIA in the last 168 hours. Coagulation Profile: Recent Labs  Lab 03/15/20 1325  INR 1.1   Cardiac Enzymes: No results for input(s): CKTOTAL, CKMB, CKMBINDEX, TROPONINI in the last 168 hours. BNP (last 3 results) No results for input(s): PROBNP in the last 8760 hours. HbA1C: No results for input(s): HGBA1C in the last 72 hours. CBG: No results for input(s): GLUCAP in the last 168 hours. Lipid Profile: No results for input(s): CHOL, HDL, LDLCALC, TRIG, CHOLHDL, LDLDIRECT in the last 72 hours. Thyroid Function Tests: No results for  input(s): TSH, T4TOTAL, FREET4, T3FREE, THYROIDAB in the last 72 hours. Anemia Panel: No results for input(s): VITAMINB12, FOLATE, FERRITIN, TIBC, IRON, RETICCTPCT in the last 72 hours. Urine analysis:    Component Value Date/Time   COLORURINE AMBER (A) 03/15/2020 2000   APPEARANCEUR TURBID (A) 03/15/2020 2000   LABSPEC 1.013 03/15/2020 2000   PHURINE 6.0 03/15/2020 2000   GLUCOSEU NEGATIVE 03/15/2020 2000   HGBUR NEGATIVE 03/15/2020 2000   BILIRUBINUR NEGATIVE 03/15/2020 2000   KETONESUR 20 (A) 03/15/2020 2000   PROTEINUR NEGATIVE 03/15/2020 2000   NITRITE NEGATIVE 03/15/2020 2000   LEUKOCYTESUR MODERATE (A) 03/15/2020 2000   Sepsis Labs: '@LABRCNTIP' (procalcitonin:4,lacticidven:4) )No results found for this or any previous visit (from the past 240 hour(s)).   Radiological Exams on Admission: CT Head Wo Contrast  Result Date: 03/14/2020 CLINICAL DATA:  Monocular vision loss EXAM: CT HEAD WITHOUT CONTRAST TECHNIQUE: Contiguous axial images were obtained from the base of the skull through the vertex without intravenous contrast. COMPARISON:  None. FINDINGS: Brain: No evidence of acute territorial infarction, hemorrhage, hydrocephalus,extra-axial collection or mass lesion/mass effect. Normal gray-white differentiation. Ventricles are normal in size and contour. Vascular: No hyperdense vessel or unexpected calcification. Skull: The skull is intact. No fracture or focal lesion identified. Sinuses/Orbits: Fluid and mucosal thickening seen within the bilateral maxillary sinuses and ethmoid air cells. There is also complete opacification of the sphenoid air cells and right frontal sinus. The orbits and globes intact. Other: None IMPRESSION: No acute intracranial abnormality. Findings which could be suggestive of pansinusitis. Electronically Signed   By: Prudencio Pair M.D.   On: 03/14/2020 18:18   MR ANGIO HEAD WO CONTRAST  Result Date: 03/15/2020 CLINICAL DATA:  Worsening right eye vision loss.  Concern for stroke. EXAM: MR HEAD WITHOUT CONTRAST MR CIRCLE OF WILLIS WITHOUT CONTRAST MRA OF THE NECK WITHOUT AND WITH CONTRAST TECHNIQUE: Multiplanar, multiecho pulse sequences of the brain, circle of willis and surrounding structures were obtained without intravenous contrast. Angiographic images of the neck were obtained using MRA technique without and with intravenous contrast. CONTRAST:  27m GADAVIST GADOBUTROL 1 MMOL/ML IV SOLN COMPARISON:  Head CT 03/14/2020 FINDINGS: MR HEAD FINDINGS Brain: There is no evidence of an acute infarct, intracranial hemorrhage, mass, midline shift, or extra-axial fluid collection. The ventricles and sulci are normal. Small foci of T2 hyperintensity in the cerebral white matter bilaterally are nonspecific but compatible with mild chronic small vessel ischemic disease. No abnormal enhancement is identified. Vascular: Major intracranial vascular flow voids are preserved. Skull and upper cervical spine: Unremarkable bone marrow signal. Sinuses/Orbits: Unremarkable orbits. Widespread mucosal thickening in the paranasal sinuses with complete opacification of the right frontal sinus and right sphenoid sinus and subtotal opacification of the left sphenoid sinus. Bilateral maxillary sinus fluid. Clear mastoid air cells. Other: None. MR CIRCLE OF WILLIS FINDINGS The intracranial vertebral arteries are widely patent to the basilar. The left PICA and right AICA appear dominant. Widely patent SCA origins are seen bilaterally. The basilar artery is widely patent. There are small posterior communicating arteries bilaterally. Both PCAs are patent without evidence of a significant proximal stenosis. The internal carotid arteries are patent from skull base to carotid termini without evidence of a significant stenosis. Artifact is noted in the proximal petrous ICA segments bilaterally. ACAs and MCAs are patent without evidence of a proximal branch occlusion or significant proximal stenosis. No  aneurysm is identified. MRA NECK FINDINGS The study is mildly motion degraded. There is a standard 3 vessel aortic arch. The brachiocephalic and subclavian arteries are widely patent. The common carotid and cervical internal carotid arteries are patent bilaterally without evidence of significant stenosis or dissection. There is mild beading of the mid to distal right cervical ICA. The vertebral arteries are patent and codominant with antegrade flow bilaterally. No significant vertebral artery stenosis or dissection is identified. IMPRESSION: 1. No acute intracranial abnormality. 2. Mild chronic small vessel ischemic disease. 3. Pansinusitis. 4. Negative head MRA. 5. Widely patent cervical carotid and vertebral arteries. 6. Possible mild changes of fibromuscular dysplasia in the right cervical ICA. Electronically Signed   By: ALogan BoresM.D.   On: 03/15/2020 16:45   MR Angiogram Neck W or Wo Contrast  Result Date: 03/15/2020 CLINICAL DATA:  Worsening right eye vision loss. Concern for stroke. EXAM: MR HEAD WITHOUT CONTRAST MR CIRCLE OF WILLIS  WITHOUT CONTRAST MRA OF THE NECK WITHOUT AND WITH CONTRAST TECHNIQUE: Multiplanar, multiecho pulse sequences of the brain, circle of willis and surrounding structures were obtained without intravenous contrast. Angiographic images of the neck were obtained using MRA technique without and with intravenous contrast. CONTRAST:  55m GADAVIST GADOBUTROL 1 MMOL/ML IV SOLN COMPARISON:  Head CT 03/14/2020 FINDINGS: MR HEAD FINDINGS Brain: There is no evidence of an acute infarct, intracranial hemorrhage, mass, midline shift, or extra-axial fluid collection. The ventricles and sulci are normal. Small foci of T2 hyperintensity in the cerebral white matter bilaterally are nonspecific but compatible with mild chronic small vessel ischemic disease. No abnormal enhancement is identified. Vascular: Major intracranial vascular flow voids are preserved. Skull and upper cervical spine:  Unremarkable bone marrow signal. Sinuses/Orbits: Unremarkable orbits. Widespread mucosal thickening in the paranasal sinuses with complete opacification of the right frontal sinus and right sphenoid sinus and subtotal opacification of the left sphenoid sinus. Bilateral maxillary sinus fluid. Clear mastoid air cells. Other: None. MR CIRCLE OF WILLIS FINDINGS The intracranial vertebral arteries are widely patent to the basilar. The left PICA and right AICA appear dominant. Widely patent SCA origins are seen bilaterally. The basilar artery is widely patent. There are small posterior communicating arteries bilaterally. Both PCAs are patent without evidence of a significant proximal stenosis. The internal carotid arteries are patent from skull base to carotid termini without evidence of a significant stenosis. Artifact is noted in the proximal petrous ICA segments bilaterally. ACAs and MCAs are patent without evidence of a proximal branch occlusion or significant proximal stenosis. No aneurysm is identified. MRA NECK FINDINGS The study is mildly motion degraded. There is a standard 3 vessel aortic arch. The brachiocephalic and subclavian arteries are widely patent. The common carotid and cervical internal carotid arteries are patent bilaterally without evidence of significant stenosis or dissection. There is mild beading of the mid to distal right cervical ICA. The vertebral arteries are patent and codominant with antegrade flow bilaterally. No significant vertebral artery stenosis or dissection is identified. IMPRESSION: 1. No acute intracranial abnormality. 2. Mild chronic small vessel ischemic disease. 3. Pansinusitis. 4. Negative head MRA. 5. Widely patent cervical carotid and vertebral arteries. 6. Possible mild changes of fibromuscular dysplasia in the right cervical ICA. Electronically Signed   By: ALogan BoresM.D.   On: 03/15/2020 16:45   MR BRAIN WO CONTRAST  Result Date: 03/15/2020 CLINICAL DATA:   Worsening right eye vision loss. Concern for stroke. EXAM: MR HEAD WITHOUT CONTRAST MR CIRCLE OF WILLIS WITHOUT CONTRAST MRA OF THE NECK WITHOUT AND WITH CONTRAST TECHNIQUE: Multiplanar, multiecho pulse sequences of the brain, circle of willis and surrounding structures were obtained without intravenous contrast. Angiographic images of the neck were obtained using MRA technique without and with intravenous contrast. CONTRAST:  821mGADAVIST GADOBUTROL 1 MMOL/ML IV SOLN COMPARISON:  Head CT 03/14/2020 FINDINGS: MR HEAD FINDINGS Brain: There is no evidence of an acute infarct, intracranial hemorrhage, mass, midline shift, or extra-axial fluid collection. The ventricles and sulci are normal. Small foci of T2 hyperintensity in the cerebral white matter bilaterally are nonspecific but compatible with mild chronic small vessel ischemic disease. No abnormal enhancement is identified. Vascular: Major intracranial vascular flow voids are preserved. Skull and upper cervical spine: Unremarkable bone marrow signal. Sinuses/Orbits: Unremarkable orbits. Widespread mucosal thickening in the paranasal sinuses with complete opacification of the right frontal sinus and right sphenoid sinus and subtotal opacification of the left sphenoid sinus. Bilateral maxillary sinus fluid. Clear mastoid air cells.  Other: None. MR CIRCLE OF WILLIS FINDINGS The intracranial vertebral arteries are widely patent to the basilar. The left PICA and right AICA appear dominant. Widely patent SCA origins are seen bilaterally. The basilar artery is widely patent. There are small posterior communicating arteries bilaterally. Both PCAs are patent without evidence of a significant proximal stenosis. The internal carotid arteries are patent from skull base to carotid termini without evidence of a significant stenosis. Artifact is noted in the proximal petrous ICA segments bilaterally. ACAs and MCAs are patent without evidence of a proximal branch occlusion or  significant proximal stenosis. No aneurysm is identified. MRA NECK FINDINGS The study is mildly motion degraded. There is a standard 3 vessel aortic arch. The brachiocephalic and subclavian arteries are widely patent. The common carotid and cervical internal carotid arteries are patent bilaterally without evidence of significant stenosis or dissection. There is mild beading of the mid to distal right cervical ICA. The vertebral arteries are patent and codominant with antegrade flow bilaterally. No significant vertebral artery stenosis or dissection is identified. IMPRESSION: 1. No acute intracranial abnormality. 2. Mild chronic small vessel ischemic disease. 3. Pansinusitis. 4. Negative head MRA. 5. Widely patent cervical carotid and vertebral arteries. 6. Possible mild changes of fibromuscular dysplasia in the right cervical ICA. Electronically Signed   By: Logan Bores M.D.   On: 03/15/2020 16:45     Assessment/Plan Principal Problem:   Vision loss Active Problems:   Essential hypertension   COPD (chronic obstructive pulmonary disease) ([place])    #1.  Right eye painless vision loss -appreciate neurology consult.  MRI brain with and without and MRI orbit with and without contrast are pending.  As per the neurology recommendation if sed rate is elevated low threshold to start IV Solu-Medrol.  The following other recommendations per the neurology - serum NMO panel, MOG Ab, RPR, serum ACE, B Henselae Ab, Brucella Ab, Lyme Ab.STAT ESR and CRP. Low threshold to start IV Solumedrol 1G daily x 5 doses if ESR is elevated and potentially concerning for Giant Cell Arteritis. #2.  Hypertension we will continue with patient's losartan but will hold off diuretics since patient is receiving fluids.  As needed IV hydralazine. #3.  History of COPD recent exacerbation presently not wheezing.  Continue inhalers as needed. #4.  History of prediabetes.  Check hemoglobin A1c. #5.  Possible UTI on ceftriaxone.  Follow  urine cultures.  Covid test and hemoglobin A1c are pending.   DVT prophylaxis: SCDs for now.  If neurology is not planning any procedures like lumbar puncture may start Lovenox. Code Status: Full code. Family Communication: Discussed with patient. Disposition Plan: Home. Consults called: Neurology. Admission status: Observation.   Rise Patience MD [facility] Pager [phone].  If 7PM-7AM, please contact night-coverage [email or web address] Password [facility]  03/16/2020, 5:15 AM

## 2020-03-16 NOTE — Consult Note (Signed)
Reason for Consult:sinusitis Referring Physician: hospitalist  Deborah Marsh is an 69 y.o. female.  HPI: 69 y.o. female with history of hypertension, COPD, prediabetes with  poor vision in the right eye.  Patient symptoms started on august 14 with mild blurriness of the vision with no eye pain or tearing.  Patient has history of left eye central vision loss since age 20.  Patient's right blurry vision gradually worsened and presented to the ER at [facility] on August 15th 2021 CT head was done which was unremarkable except for sphenoid opacification right and was referred to ophthalmologist.  Patient went to the ophthalmologist on March 15, 2020 and was advised to come to the ER for further work-up as per the report from the ER physician ophthalmologist did not see any cause for the right eye vision loss. The patient has complete loss of vision on the right eye.She has no chronic hx of sinusitis. She has 1-2 month hx of some nose congestion and little pressure. No excessive symptoms. In the last week she has had congestion again and was treated with amoxicillin and flonase. She states the symptoms went away. She has had headache very mild in the frontal area for 2-3 days. She has had mild green drainage also for only few days.   Past Medical History:  Diagnosis Date  . COPD (chronic obstructive pulmonary disease) (HCC)   . Hypertension     History reviewed. No pertinent surgical history.  Family History  Problem Relation Age of Onset  . Glaucoma Mother     Social History:  reports that she has quit smoking. She has never used smokeless tobacco. She reports that she does not drink alcohol and does not use drugs.  Allergies: No Known Allergies  Medications: I have reviewed the patient's current medications.  Results for orders placed or performed during the hospital encounter of 03/15/20 (from the past 48 hour(s))  Ethanol     Status: None   Collection Time: 03/15/20  1:25 PM   Result Value Ref Range   Alcohol, Ethyl (B) <10 <10 mg/dL    Comment: (NOTE) Lowest detectable limit for serum alcohol is 10 mg/dL.  For medical purposes only. Performed at [facility], [address] [place], [place] [address]   Protime-INR     Status: None   Collection Time: 03/15/20  1:25 PM  Result Value Ref Range   Prothrombin Time 13.6 11.4 - 15.2 seconds   INR 1.1 0.8 - 1.2    Comment: (NOTE) INR goal varies based on device and disease states. Performed at [facility], [address] [place], [place] [address]   APTT     Status: None   Collection Time: 03/15/20  1:25 PM  Result Value Ref Range   aPTT 29 24 - 36 seconds    Comment: Performed at [facility], [address], [place], [place] [address]  CBC     Status: Abnormal   Collection Time: 03/15/20  1:25 PM  Result Value Ref Range   WBC 9.1 4.0 - 10.5 K/uL   RBC 5.21 (H) 3.87 - 5.11 MIL/uL   Hemoglobin 14.9 12.0 - 15.0 g/dL   HCT 26.3 36 - 46 %   MCV 86.9 80.0 - 100.0 fL   MCH 28.6 26.0 - 34.0 pg   MCHC 32.9 30.0 - 36.0 g/dL   RDW 33.5 45.6 - 25.6 %   Platelets 272 150 - 400 K/uL   nRBC  0.0 0.0 - 0.2 %    Comment: Performed at [facility], [address], [place], [place] [address]  Differential     Status: None   Collection Time: 03/15/20  1:25 PM  Result Value Ref Range   Neutrophils Relative % 72 %   Neutro Abs 6.5 1.7 - 7.7 K/uL   Lymphocytes Relative 18 %   Lymphs Abs 1.7 0.7 - 4.0 K/uL   Monocytes Relative 7 %   Monocytes Absolute 0.6 0 - 1 K/uL   Eosinophils Relative 3 %   Eosinophils Absolute 0.3 0 - 0 K/uL   Basophils Relative 0 %   Basophils Absolute 0.0 0 - 0 K/uL   Immature Granulocytes 0 %   Abs Immature Granulocytes 0.03 0.00 - 0.07 K/uL    Comment: Performed at [facility], [address] [place], [place] [address]  Comprehensive metabolic panel     Status: Abnormal   Collection Time: 03/15/20  1:25 PM  Result Value Ref Range   Sodium 140 135 -  145 mmol/L   Potassium 3.6 3.5 - 5.1 mmol/L   Chloride 100 98 - 111 mmol/L   CO2 30 22 - 32 mmol/L   Glucose, Bld 95 70 - 99 mg/dL    Comment: Glucose reference range applies only to samples taken after fasting for at least 8 hours.   BUN 11 8 - 23 mg/dL   Creatinine, Ser 7.61 0.44 - 1.00 mg/dL   Calcium 9.8 8.9 - 60.7 mg/dL   Total Protein 7.6 6.5 - 8.1 g/dL   Albumin 4.1 3.5 - 5.0 g/dL   AST 14 (L) 15 - 41 U/L   ALT 16 0 - 44 U/L   Alkaline Phosphatase 40 38 - 126 U/L   Total Bilirubin 1.0 0.3 - 1.2 mg/dL   GFR calc non Af Amer >60 >60 mL/min   GFR calc Af Amer >60 >60 mL/min   Anion gap 10 5 - 15    Comment: Performed at [facility], [address], [place], [place] [address]  Urine rapid drug screen (hosp performed)     Status: Abnormal   Collection Time: 03/15/20  8:00 PM  Result Value Ref Range   Opiates NONE DETECTED NONE DETECTED   Cocaine NONE DETECTED NONE DETECTED   Benzodiazepines POSITIVE (A) NONE DETECTED   Amphetamines NONE DETECTED NONE DETECTED   Tetrahydrocannabinol NONE DETECTED NONE DETECTED   Barbiturates NONE DETECTED NONE DETECTED    Comment: (NOTE) DRUG SCREEN FOR MEDICAL PURPOSES ONLY.  IF CONFIRMATION IS NEEDED FOR ANY PURPOSE, NOTIFY LAB WITHIN 5 DAYS.  LOWEST DETECTABLE LIMITS FOR URINE DRUG SCREEN Drug Class                     Cutoff (ng/mL) Amphetamine and metabolites    1000 Barbiturate and metabolites    200 Benzodiazepine                 200 Tricyclics and metabolites     300 Opiates and metabolites        300 Cocaine and metabolites        300 THC                            50 Performed at [facility], [address] [place], [place] [address]   Urinalysis, Routine w reflex microscopic Urine, Clean Catch     Status:  Abnormal   Collection Time: 03/15/20  8:00 PM  Result Value Ref Range   Color, Urine AMBER (A) YELLOW    Comment: BIOCHEMICALS MAY BE AFFECTED BY COLOR   APPearance TURBID (A) CLEAR   Specific Gravity,  Urine 1.013 1.005 - 1.030   pH 6.0 5.0 - 8.0   Glucose, UA NEGATIVE NEGATIVE mg/dL   Hgb urine dipstick NEGATIVE NEGATIVE   Bilirubin Urine NEGATIVE NEGATIVE   Ketones, ur 20 (A) NEGATIVE mg/dL   Protein, ur NEGATIVE NEGATIVE mg/dL   Nitrite NEGATIVE NEGATIVE   Leukocytes,Ua MODERATE (A) NEGATIVE   RBC / HPF 0-5 0 - 5 RBC/hpf   WBC, UA 21-50 0 - 5 WBC/hpf   Bacteria, UA FEW (A) NONE SEEN   Squamous Epithelial / LPF 0-5 0 - 5    Comment: Performed at [facility] [place] [facility], [address], [place], [place] [address]  SARS Coronavirus 2 by RT PCR (hospital order, performed in [facility] hospital lab) Nasopharyngeal Nasopharyngeal Swab     Status: None   Collection Time: 03/16/20  3:39 AM   Specimen: Nasopharyngeal Swab  Result Value Ref Range   SARS Coronavirus 2 NEGATIVE NEGATIVE    Comment: (NOTE) SARS-CoV-2 target nucleic acids are NOT DETECTED.  The SARS-CoV-2 RNA is generally detectable in upper and lower respiratory specimens during the acute phase of infection. The lowest concentration of SARS-CoV-2 viral copies this assay can detect is 250 copies / mL. A negative result does not preclude SARS-CoV-2 infection and should not be used as the sole basis for treatment or other patient management decisions.  A negative result may occur with improper specimen collection / handling, submission of specimen other than nasopharyngeal swab, presence of viral mutation(s) within the areas targeted by this assay, and inadequate number of viral copies (<250 copies / mL). A negative result must be combined with clinical observations, patient history, and epidemiological information.  Fact Sheet for Patients:   BoilerBrush.com.cy  Fact Sheet for Healthcare Providers: https://pope.com/  This test is not yet approved or  cleared by the [place] FDA and has been authorized for detection and/or diagnosis of SARS-CoV-2 by FDA under an Emergency  Use Authorization (EUA).  This EUA will remain in effect (meaning this test can be used) for the duration of the COVID-19 declaration under Section 564(b)(1) of the Act, 21 U.S.C. section 360bbb-3(b)(1), unless the authorization is terminated or revoked sooner.  Performed at [facility], [address], [place], [place] [address]   Sedimentation rate     Status: None   Collection Time: 03/16/20  5:39 AM  Result Value Ref Range   Sed Rate 10 0 - 22 mm/hr    Comment: Performed at [facility], [address] [place], [place] [address]  C-reactive protein     Status: Abnormal   Collection Time: 03/16/20  5:39 AM  Result Value Ref Range   CRP 1.9 (H) <1.0 mg/dL    Comment: Performed at [facility], [address] [place], [place] [address]  Hemoglobin A1c     Status: Abnormal   Collection Time: 03/16/20  5:39 AM  Result Value Ref Range   Hgb A1c MFr Bld 5.8 (H) 4.8 - 5.6 %    Comment: (NOTE) Pre diabetes:          5.7%-6.4%  Diabetes:              >6.4%  Glycemic control for   <7.0% adults with  diabetes    Mean Plasma Glucose 119.76 mg/dL    Comment: Performed at [facility], [address] [place], [place] [address]  Lipid panel     Status: Abnormal   Collection Time: 03/16/20  5:39 AM  Result Value Ref Range   Cholesterol 200 0 - 200 mg/dL   Triglycerides 89 <604<150 mg/dL   HDL 71 >54>40 mg/dL   Total CHOL/HDL Ratio 2.8 RATIO   VLDL 18 0 - 40 mg/dL   LDL Cholesterol 098111 (H) 0 - 99 mg/dL    Comment:        Total Cholesterol/HDL:CHD Risk Coronary Heart Disease Risk Table                     Men   Women  1/2 Average Risk   3.4   3.3  Average Risk       5.0   4.4  2 X Average Risk   9.6   7.1  3 X Average Risk  23.4   11.0        Use the calculated Patient Ratio above and the CHD Risk Table to determine the patient's CHD Risk.        ATP III CLASSIFICATION (LDL):  <100     mg/dL   Optimal  [id number]  mg/dL   Near or Above                     Optimal  130-159  mg/dL   Borderline  [id number]  mg/dL   High  >130>190     mg/dL   Very High Performed at [facility], [address] [place], [place] [address]     CT Head Wo Contrast  Result Date: 03/14/2020 CLINICAL DATA:  Monocular vision loss EXAM: CT HEAD WITHOUT CONTRAST TECHNIQUE: Contiguous axial images were obtained from the base of the skull through the vertex without intravenous contrast. COMPARISON:  None. FINDINGS: Brain: No evidence of acute territorial infarction, hemorrhage, hydrocephalus,extra-axial collection or mass lesion/mass effect. Normal gray-white differentiation. Ventricles are normal in size and contour. Vascular: No hyperdense vessel or unexpected calcification. Skull: The skull is intact. No fracture or focal lesion identified. Sinuses/Orbits: Fluid and mucosal thickening seen within the bilateral maxillary sinuses and ethmoid air cells. There is also complete opacification of the sphenoid air cells and right frontal sinus. The orbits and globes intact. Other: None IMPRESSION: No acute intracranial abnormality. Findings which could be suggestive of pansinusitis. Electronically Signed   By: Jonna ClarkBindu  Avutu M.D.   On: 03/14/2020 18:18   MR ANGIO HEAD WO CONTRAST  Result Date: 03/15/2020 CLINICAL DATA:  Worsening right eye vision loss. Concern for stroke. EXAM: MR HEAD WITHOUT CONTRAST MR CIRCLE OF WILLIS WITHOUT CONTRAST MRA OF THE NECK WITHOUT AND WITH CONTRAST TECHNIQUE: Multiplanar, multiecho pulse sequences of the brain, circle of willis and surrounding structures were obtained without intravenous contrast. Angiographic images of the neck were obtained using MRA technique without and with intravenous contrast. CONTRAST:  8mL GADAVIST GADOBUTROL 1 MMOL/ML IV SOLN COMPARISON:  Head CT 03/14/2020 FINDINGS: MR HEAD FINDINGS Brain: There is no evidence of an acute infarct, intracranial hemorrhage, mass, midline shift, or extra-axial fluid collection. The ventricles and sulci  are normal. Small foci of T2 hyperintensity in the cerebral white matter bilaterally are nonspecific but compatible with mild chronic small vessel ischemic disease. No abnormal enhancement is identified. Vascular: Major intracranial vascular flow voids are preserved. Skull and upper cervical spine:  Unremarkable bone marrow signal. Sinuses/Orbits: Unremarkable orbits. Widespread mucosal thickening in the paranasal sinuses with complete opacification of the right frontal sinus and right sphenoid sinus and subtotal opacification of the left sphenoid sinus. Bilateral maxillary sinus fluid. Clear mastoid air cells. Other: None. MR CIRCLE OF WILLIS FINDINGS The intracranial vertebral arteries are widely patent to the basilar. The left PICA and right AICA appear dominant. Widely patent SCA origins are seen bilaterally. The basilar artery is widely patent. There are small posterior communicating arteries bilaterally. Both PCAs are patent without evidence of a significant proximal stenosis. The internal carotid arteries are patent from skull base to carotid termini without evidence of a significant stenosis. Artifact is noted in the proximal petrous ICA segments bilaterally. ACAs and MCAs are patent without evidence of a proximal branch occlusion or significant proximal stenosis. No aneurysm is identified. MRA NECK FINDINGS The study is mildly motion degraded. There is a standard 3 vessel aortic arch. The brachiocephalic and subclavian arteries are widely patent. The common carotid and cervical internal carotid arteries are patent bilaterally without evidence of significant stenosis or dissection. There is mild beading of the mid to distal right cervical ICA. The vertebral arteries are patent and codominant with antegrade flow bilaterally. No significant vertebral artery stenosis or dissection is identified. IMPRESSION: 1. No acute intracranial abnormality. 2. Mild chronic small vessel ischemic disease. 3. Pansinusitis. 4.  Negative head MRA. 5. Widely patent cervical carotid and vertebral arteries. 6. Possible mild changes of fibromuscular dysplasia in the right cervical ICA. Electronically Signed   By: Sebastian Ache M.D.   On: 03/15/2020 16:45   MR Angiogram Neck W or Wo Contrast  Result Date: 03/15/2020 CLINICAL DATA:  Worsening right eye vision loss. Concern for stroke. EXAM: MR HEAD WITHOUT CONTRAST MR CIRCLE OF WILLIS WITHOUT CONTRAST MRA OF THE NECK WITHOUT AND WITH CONTRAST TECHNIQUE: Multiplanar, multiecho pulse sequences of the brain, circle of willis and surrounding structures were obtained without intravenous contrast. Angiographic images of the neck were obtained using MRA technique without and with intravenous contrast. CONTRAST:  8mL GADAVIST GADOBUTROL 1 MMOL/ML IV SOLN COMPARISON:  Head CT 03/14/2020 FINDINGS: MR HEAD FINDINGS Brain: There is no evidence of an acute infarct, intracranial hemorrhage, mass, midline shift, or extra-axial fluid collection. The ventricles and sulci are normal. Small foci of T2 hyperintensity in the cerebral white matter bilaterally are nonspecific but compatible with mild chronic small vessel ischemic disease. No abnormal enhancement is identified. Vascular: Major intracranial vascular flow voids are preserved. Skull and upper cervical spine: Unremarkable bone marrow signal. Sinuses/Orbits: Unremarkable orbits. Widespread mucosal thickening in the paranasal sinuses with complete opacification of the right frontal sinus and right sphenoid sinus and subtotal opacification of the left sphenoid sinus. Bilateral maxillary sinus fluid. Clear mastoid air cells. Other: None. MR CIRCLE OF WILLIS FINDINGS The intracranial vertebral arteries are widely patent to the basilar. The left PICA and right AICA appear dominant. Widely patent SCA origins are seen bilaterally. The basilar artery is widely patent. There are small posterior communicating arteries bilaterally. Both PCAs are patent without  evidence of a significant proximal stenosis. The internal carotid arteries are patent from skull base to carotid termini without evidence of a significant stenosis. Artifact is noted in the proximal petrous ICA segments bilaterally. ACAs and MCAs are patent without evidence of a proximal branch occlusion or significant proximal stenosis. No aneurysm is identified. MRA NECK FINDINGS The study is mildly motion degraded. There is a standard 3 vessel aortic arch. The brachiocephalic  and subclavian arteries are widely patent. The common carotid and cervical internal carotid arteries are patent bilaterally without evidence of significant stenosis or dissection. There is mild beading of the mid to distal right cervical ICA. The vertebral arteries are patent and codominant with antegrade flow bilaterally. No significant vertebral artery stenosis or dissection is identified. IMPRESSION: 1. No acute intracranial abnormality. 2. Mild chronic small vessel ischemic disease. 3. Pansinusitis. 4. Negative head MRA. 5. Widely patent cervical carotid and vertebral arteries. 6. Possible mild changes of fibromuscular dysplasia in the right cervical ICA. Electronically Signed   By: Sebastian Ache M.D.   On: 03/15/2020 16:45   MR BRAIN WO CONTRAST  Result Date: 03/15/2020 CLINICAL DATA:  Worsening right eye vision loss. Concern for stroke. EXAM: MR HEAD WITHOUT CONTRAST MR CIRCLE OF WILLIS WITHOUT CONTRAST MRA OF THE NECK WITHOUT AND WITH CONTRAST TECHNIQUE: Multiplanar, multiecho pulse sequences of the brain, circle of willis and surrounding structures were obtained without intravenous contrast. Angiographic images of the neck were obtained using MRA technique without and with intravenous contrast. CONTRAST:  8mL GADAVIST GADOBUTROL 1 MMOL/ML IV SOLN COMPARISON:  Head CT 03/14/2020 FINDINGS: MR HEAD FINDINGS Brain: There is no evidence of an acute infarct, intracranial hemorrhage, mass, midline shift, or extra-axial fluid collection.  The ventricles and sulci are normal. Small foci of T2 hyperintensity in the cerebral white matter bilaterally are nonspecific but compatible with mild chronic small vessel ischemic disease. No abnormal enhancement is identified. Vascular: Major intracranial vascular flow voids are preserved. Skull and upper cervical spine: Unremarkable bone marrow signal. Sinuses/Orbits: Unremarkable orbits. Widespread mucosal thickening in the paranasal sinuses with complete opacification of the right frontal sinus and right sphenoid sinus and subtotal opacification of the left sphenoid sinus. Bilateral maxillary sinus fluid. Clear mastoid air cells. Other: None. MR CIRCLE OF WILLIS FINDINGS The intracranial vertebral arteries are widely patent to the basilar. The left PICA and right AICA appear dominant. Widely patent SCA origins are seen bilaterally. The basilar artery is widely patent. There are small posterior communicating arteries bilaterally. Both PCAs are patent without evidence of a significant proximal stenosis. The internal carotid arteries are patent from skull base to carotid termini without evidence of a significant stenosis. Artifact is noted in the proximal petrous ICA segments bilaterally. ACAs and MCAs are patent without evidence of a proximal branch occlusion or significant proximal stenosis. No aneurysm is identified. MRA NECK FINDINGS The study is mildly motion degraded. There is a standard 3 vessel aortic arch. The brachiocephalic and subclavian arteries are widely patent. The common carotid and cervical internal carotid arteries are patent bilaterally without evidence of significant stenosis or dissection. There is mild beading of the mid to distal right cervical ICA. The vertebral arteries are patent and codominant with antegrade flow bilaterally. No significant vertebral artery stenosis or dissection is identified. IMPRESSION: 1. No acute intracranial abnormality. 2. Mild chronic small vessel ischemic  disease. 3. Pansinusitis. 4. Negative head MRA. 5. Widely patent cervical carotid and vertebral arteries. 6. Possible mild changes of fibromuscular dysplasia in the right cervical ICA. Electronically Signed   By: Sebastian Ache M.D.   On: 03/15/2020 16:45   MR BRAIN W CONTRAST  Addendum Date: 03/16/2020   ADDENDUM REPORT: 03/16/2020 09:01 ADDENDUM: These results were called by telephone at the time of interpretation on 03/16/2020 at 9:00 am to provider Hanley Ben, who verbally acknowledged these results. Electronically Signed   By: Marlan Palau M.D.   On: 03/16/2020 09:01   Result Date:  03/16/2020 CLINICAL DATA:  Right eye vision loss EXAM: MRI HEAD WITH CONTRAST MRI ORBITS WITHOUT AND WITH CONTRAST TECHNIQUE: Multiplanar, multiecho pulse sequences of the brain and surrounding structures were obtained with intravenous contrast. Multiplanar, multiecho pulse sequences of the orbits and surrounding structures were obtained including fat saturation techniques, before and after intravenous contrast administration. CONTRAST:  8mL GADAVIST GADOBUTROL 1 MMOL/ML IV SOLN COMPARISON:  MRI head without with contrast 03/15/2020 FINDINGS: MRI HEAD FINDINGS There is mild dural thickening and enhancement along the planum sphenoidale on the right. This is unchanged from yesterday. Remainder of the enhancement of the brain and surrounding structures is normal. There is extensive associated sinus mucosal disease throughout the paranasal sinuses including the sphenoid sinus bilaterally. See additional discussion below. Pituitary enhances normally. Infundibulum midline. No enhancing skeletal lesion. MRI ORBITS FINDINGS Orbits: Globe is symmetric and normal bilaterally. No orbital mass or edema. There is dural thickening and enhancement along the planum sphenoidale on the right. See axial postcontrast image 10 and coronal postcontrast image 9. This is in the region of the optic canal. There appears to be some swelling of the optic nerve  within the optic canal with surrounding enhancement, series 18, image 20. There is extensive mucosal edema in the adjacent sphenoid sinus and this may represent inflammatory or infectious process. Remainder of the optic nerves are normal. Pituitary normal.  Cavernous sinus normal. Visualized sinuses: Extensive mucosal edema throughout the paranasal sinuses. Air-fluid levels in the maxillary sinus bilaterally. Soft tissues: No soft tissue mass or edema. IMPRESSION: Dural thickening along the planum sphenoidale on the right. There appears to be edema in the right optic nerve extending through the optic canal with surrounding enhancement suggestive of inflammatory or infectious process likely related to sinusitis. Tumor not likely. No enhancement of the adjacent brain. No brain abscess. Electronically Signed: By: Marlan Palau M.D. On: 03/16/2020 08:57   MR ORBITS W WO CONTRAST  Addendum Date: 03/16/2020   ADDENDUM REPORT: 03/16/2020 09:01 ADDENDUM: These results were called by telephone at the time of interpretation on 03/16/2020 at 9:00 am to provider Hanley Ben, who verbally acknowledged these results. Electronically Signed   By: Marlan Palau M.D.   On: 03/16/2020 09:01   Result Date: 03/16/2020 CLINICAL DATA:  Right eye vision loss EXAM: MRI HEAD WITH CONTRAST MRI ORBITS WITHOUT AND WITH CONTRAST TECHNIQUE: Multiplanar, multiecho pulse sequences of the brain and surrounding structures were obtained with intravenous contrast. Multiplanar, multiecho pulse sequences of the orbits and surrounding structures were obtained including fat saturation techniques, before and after intravenous contrast administration. CONTRAST:  8mL GADAVIST GADOBUTROL 1 MMOL/ML IV SOLN COMPARISON:  MRI head without with contrast 03/15/2020 FINDINGS: MRI HEAD FINDINGS There is mild dural thickening and enhancement along the planum sphenoidale on the right. This is unchanged from yesterday. Remainder of the enhancement of the brain and  surrounding structures is normal. There is extensive associated sinus mucosal disease throughout the paranasal sinuses including the sphenoid sinus bilaterally. See additional discussion below. Pituitary enhances normally. Infundibulum midline. No enhancing skeletal lesion. MRI ORBITS FINDINGS Orbits: Globe is symmetric and normal bilaterally. No orbital mass or edema. There is dural thickening and enhancement along the planum sphenoidale on the right. See axial postcontrast image 10 and coronal postcontrast image 9. This is in the region of the optic canal. There appears to be some swelling of the optic nerve within the optic canal with surrounding enhancement, series 18, image 20. There is extensive mucosal edema in the adjacent sphenoid sinus and  this may represent inflammatory or infectious process. Remainder of the optic nerves are normal. Pituitary normal.  Cavernous sinus normal. Visualized sinuses: Extensive mucosal edema throughout the paranasal sinuses. Air-fluid levels in the maxillary sinus bilaterally. Soft tissues: No soft tissue mass or edema. IMPRESSION: Dural thickening along the planum sphenoidale on the right. There appears to be edema in the right optic nerve extending through the optic canal with surrounding enhancement suggestive of inflammatory or infectious process likely related to sinusitis. Tumor not likely. No enhancement of the adjacent brain. No brain abscess. Electronically Signed: By: Marlan Palau M.D. On: 03/16/2020 08:57    Review of Systems Blood pressure 126/72, pulse (!) 57, temperature 98.3 F (36.8 C), temperature source Oral, resp. rate 18, SpO2 99 %. Physical Exam Constitutional:      Appearance: Normal appearance.  HENT:     Head: Normocephalic.     Nose: Nose normal.     Mouth/Throat:     Mouth: Mucous membranes are moist.  Eyes:     Conjunctiva/sclera: Conjunctivae normal.     Comments: There is no swelling or change in the eye consistent with any  inflammation. No facial swelling.   Musculoskeletal:     Cervical back: Normal range of motion.  Neurological:     Mental Status: She is alert.     Assessment/Plan: Sphenoid sinustis with vision loss- it certainly is possible the sinusitis is the source of the inflammation around the optic nerve. There does not appear to be any dehiscence or mass effect. The right sphenoid is totally opacified as well as the right frontal. She now has been totally without light perception in the right eye for 2 days. We discussed the option of decompressing the right sphenoid acutely which might help get the inflammatory load out of the sinus. This likely is a vasculitis type response from the inflammation and not direct extension. She has no interest in an operation and wants to treat with antbiotics first. I agree with the Unasyn or Zosyn and steroids. She understands this option may not bring vision back and there is no way to know if sinus surgery would either. She states even if she knew there was a chance of the operation improving she does not want to go through that. She needs a CT scan of the sinus to better define the anatomy of the bone.   Suzanna Obey 03/16/2020, 11:23 AM

## 2020-03-16 NOTE — Progress Notes (Addendum)
Pharmacy Antibiotic Note  Deborah Marsh is a 69 y.o. female admitted on 03/15/2020 with optic nerve thickening, sinusitis, UTI.  Pharmacy has been consulted for Unasyn dosing. SCr 0.92.  Plan: D/c ceftriaxone ordered for UTI per discussion with Dr. Hanley Ben Start Unasyn 3g IV q6h Monitor clinical progress, c/s, renal function F/u de-escalation plan/LOT Pharmacy will sign off consult and monitor peripherally      Temp (24hrs), Avg:98 F (36.7 C), Min:97.9 F (36.6 C), Max:98.3 F (36.8 C)  Recent Labs  Lab 03/14/20 1706 03/15/20 1325  WBC 9.0 9.1  CREATININE 1.05* 0.92    Estimated Creatinine Clearance: 59.1 mL/min (by C-G formula based on SCr of 0.92 mg/dL).    No Known Allergies  Leia Alf, PharmD, BCPS Please check AMION for all [facility] contact numbers Clinical Pharmacist 03/16/2020 9:24 AM

## 2020-03-16 NOTE — ED Notes (Signed)
Neurology at bedside.

## 2020-03-16 NOTE — ED Provider Notes (Signed)
[place] EMERGENCY DEPARTMENT Provider Note   CSN: [id number] Arrival date & time: 03/15/20  0932     History Chief Complaint  Patient presents with  . Loss of Vision    Deborah Marsh is a 69 y.o. female.  The history is provided by the patient and medical records.     69 y.o. F with hx of COPD, DM, HTN, presenting to the ED for vision loss in right eye.  States 03/14/20 she started having trouble with vision upon waking that morning-- was mostly blurry with some darkness but not total vision loss.  Vision was normal went she went to bed around 10PM night prior.  States upon waking this morning she had total vision loss in right eye-- not even able to see shadows or shapes.  She already has central vision loss in left eye and is legally blind.  She saw her ophthalmologist, Dr. Posey Pronto, today who saw some edema around her right retina and was concerned for stroke so sent her to ED for MRI's.  She denies any change in the vision since seeing Dr. Posey Pronto.  Denies any focal numbness/weakness, confusion, change in speech, confusion, headaches, nausea, vomiting, pain in temples, etc.  Not currently on daily medications, daughter did give her an ASA today after eye appointment.  Past Medical History:  Diagnosis Date  . COPD (chronic obstructive pulmonary disease) ([place])   . Diabetes mellitus without complication ([place])   . Hypertension     There are no problems to display for this patient.   History reviewed. No pertinent surgical history.   OB History   No obstetric history on file.     No family history on file.  Social History   Tobacco Use  . Smoking status: Never Smoker  . Smokeless tobacco: Never Used  Substance Use Topics  . Alcohol use: Never  . Drug use: Never    Home Medications Prior to Admission medications   Not on File    Allergies    Patient has no known allergies.  Review of Systems   Review of Systems  Eyes: Positive for  visual disturbance.  All other systems reviewed and are negative.   Physical Exam Updated Vital Signs BP 139/78 (BP Location: Right Arm)   Pulse (!) 47   Temp 98 F (36.7 C) (Oral)   Resp 16   SpO2 98%   Physical Exam Vitals and nursing note reviewed.  Constitutional:      Appearance: She is well-developed.  HENT:     Head: Normocephalic and atraumatic.  Eyes:     Conjunctiva/sclera: Conjunctivae normal.     Pupils: Pupils are equal, round, and reactive to light.     Comments: Vision loss R eye, no blink to threat, EOMs intact Left eye with chronic central vision loss (unchanged)  Cardiovascular:     Rate and Rhythm: Normal rate and regular rhythm.     Heart sounds: Normal heart sounds.  Pulmonary:     Effort: Pulmonary effort is normal.     Breath sounds: Normal breath sounds.  Abdominal:     General: Bowel sounds are normal.     Palpations: Abdomen is soft.  Musculoskeletal:        General: Normal range of motion.     Cervical back: Normal range of motion.  Skin:    General: Skin is warm and dry.  Neurological:     Mental Status: She is alert and oriented to person, place,  and time.     Comments: AAOx3, answering questions and following commands appropriately; equal strength UE and LE bilaterally; CN grossly intact; moves all extremities appropriately without ataxia; no focal neuro deficits or facial asymmetry appreciated, speech clear and goal oriented     ED Results / Procedures / Treatments   Labs (all labs ordered are listed, but only abnormal results are displayed) Labs Reviewed  CBC - Abnormal; Notable for the following components:      Result Value   RBC 5.21 (*)    All other components within normal limits  COMPREHENSIVE METABOLIC PANEL - Abnormal; Notable for the following components:   AST 14 (*)    All other components within normal limits  RAPID URINE DRUG SCREEN, HOSP PERFORMED - Abnormal; Notable for the following components:   Benzodiazepines  POSITIVE (*)    All other components within normal limits  URINALYSIS, ROUTINE W REFLEX MICROSCOPIC - Abnormal; Notable for the following components:   Color, Urine AMBER (*)    APPearance TURBID (*)    Ketones, ur 20 (*)    Leukocytes,Ua MODERATE (*)    Bacteria, UA FEW (*)    All other components within normal limits  ETHANOL  PROTIME-INR  APTT  DIFFERENTIAL    EKG None  Radiology CT Head Wo Contrast  Result Date: 03/14/2020 CLINICAL DATA:  Monocular vision loss EXAM: CT HEAD WITHOUT CONTRAST TECHNIQUE: Contiguous axial images were obtained from the base of the skull through the vertex without intravenous contrast. COMPARISON:  None. FINDINGS: Brain: No evidence of acute territorial infarction, hemorrhage, hydrocephalus,extra-axial collection or mass lesion/mass effect. Normal gray-white differentiation. Ventricles are normal in size and contour. Vascular: No hyperdense vessel or unexpected calcification. Skull: The skull is intact. No fracture or focal lesion identified. Sinuses/Orbits: Fluid and mucosal thickening seen within the bilateral maxillary sinuses and ethmoid air cells. There is also complete opacification of the sphenoid air cells and right frontal sinus. The orbits and globes intact. Other: None IMPRESSION: No acute intracranial abnormality. Findings which could be suggestive of pansinusitis. Electronically Signed   By: Prudencio Pair M.D.   On: 03/14/2020 18:18   MR ANGIO HEAD WO CONTRAST  Result Date: 03/15/2020 CLINICAL DATA:  Worsening right eye vision loss. Concern for stroke. EXAM: MR HEAD WITHOUT CONTRAST MR CIRCLE OF WILLIS WITHOUT CONTRAST MRA OF THE NECK WITHOUT AND WITH CONTRAST TECHNIQUE: Multiplanar, multiecho pulse sequences of the brain, circle of willis and surrounding structures were obtained without intravenous contrast. Angiographic images of the neck were obtained using MRA technique without and with intravenous contrast. CONTRAST:  59m GADAVIST GADOBUTROL 1  MMOL/ML IV SOLN COMPARISON:  Head CT 03/14/2020 FINDINGS: MR HEAD FINDINGS Brain: There is no evidence of an acute infarct, intracranial hemorrhage, mass, midline shift, or extra-axial fluid collection. The ventricles and sulci are normal. Small foci of T2 hyperintensity in the cerebral white matter bilaterally are nonspecific but compatible with mild chronic small vessel ischemic disease. No abnormal enhancement is identified. Vascular: Major intracranial vascular flow voids are preserved. Skull and upper cervical spine: Unremarkable bone marrow signal. Sinuses/Orbits: Unremarkable orbits. Widespread mucosal thickening in the paranasal sinuses with complete opacification of the right frontal sinus and right sphenoid sinus and subtotal opacification of the left sphenoid sinus. Bilateral maxillary sinus fluid. Clear mastoid air cells. Other: None. MR CIRCLE OF WILLIS FINDINGS The intracranial vertebral arteries are widely patent to the basilar. The left PICA and right AICA appear dominant. Widely patent SCA origins are seen bilaterally. The basilar  artery is widely patent. There are small posterior communicating arteries bilaterally. Both PCAs are patent without evidence of a significant proximal stenosis. The internal carotid arteries are patent from skull base to carotid termini without evidence of a significant stenosis. Artifact is noted in the proximal petrous ICA segments bilaterally. ACAs and MCAs are patent without evidence of a proximal branch occlusion or significant proximal stenosis. No aneurysm is identified. MRA NECK FINDINGS The study is mildly motion degraded. There is a standard 3 vessel aortic arch. The brachiocephalic and subclavian arteries are widely patent. The common carotid and cervical internal carotid arteries are patent bilaterally without evidence of significant stenosis or dissection. There is mild beading of the mid to distal right cervical ICA. The vertebral arteries are patent and  codominant with antegrade flow bilaterally. No significant vertebral artery stenosis or dissection is identified. IMPRESSION: 1. No acute intracranial abnormality. 2. Mild chronic small vessel ischemic disease. 3. Pansinusitis. 4. Negative head MRA. 5. Widely patent cervical carotid and vertebral arteries. 6. Possible mild changes of fibromuscular dysplasia in the right cervical ICA. Electronically Signed   By: Logan Bores M.D.   On: 03/15/2020 16:45   MR Angiogram Neck W or Wo Contrast  Result Date: 03/15/2020 CLINICAL DATA:  Worsening right eye vision loss. Concern for stroke. EXAM: MR HEAD WITHOUT CONTRAST MR CIRCLE OF WILLIS WITHOUT CONTRAST MRA OF THE NECK WITHOUT AND WITH CONTRAST TECHNIQUE: Multiplanar, multiecho pulse sequences of the brain, circle of willis and surrounding structures were obtained without intravenous contrast. Angiographic images of the neck were obtained using MRA technique without and with intravenous contrast. CONTRAST:  3m GADAVIST GADOBUTROL 1 MMOL/ML IV SOLN COMPARISON:  Head CT 03/14/2020 FINDINGS: MR HEAD FINDINGS Brain: There is no evidence of an acute infarct, intracranial hemorrhage, mass, midline shift, or extra-axial fluid collection. The ventricles and sulci are normal. Small foci of T2 hyperintensity in the cerebral white matter bilaterally are nonspecific but compatible with mild chronic small vessel ischemic disease. No abnormal enhancement is identified. Vascular: Major intracranial vascular flow voids are preserved. Skull and upper cervical spine: Unremarkable bone marrow signal. Sinuses/Orbits: Unremarkable orbits. Widespread mucosal thickening in the paranasal sinuses with complete opacification of the right frontal sinus and right sphenoid sinus and subtotal opacification of the left sphenoid sinus. Bilateral maxillary sinus fluid. Clear mastoid air cells. Other: None. MR CIRCLE OF WILLIS FINDINGS The intracranial vertebral arteries are widely patent to the  basilar. The left PICA and right AICA appear dominant. Widely patent SCA origins are seen bilaterally. The basilar artery is widely patent. There are small posterior communicating arteries bilaterally. Both PCAs are patent without evidence of a significant proximal stenosis. The internal carotid arteries are patent from skull base to carotid termini without evidence of a significant stenosis. Artifact is noted in the proximal petrous ICA segments bilaterally. ACAs and MCAs are patent without evidence of a proximal branch occlusion or significant proximal stenosis. No aneurysm is identified. MRA NECK FINDINGS The study is mildly motion degraded. There is a standard 3 vessel aortic arch. The brachiocephalic and subclavian arteries are widely patent. The common carotid and cervical internal carotid arteries are patent bilaterally without evidence of significant stenosis or dissection. There is mild beading of the mid to distal right cervical ICA. The vertebral arteries are patent and codominant with antegrade flow bilaterally. No significant vertebral artery stenosis or dissection is identified. IMPRESSION: 1. No acute intracranial abnormality. 2. Mild chronic small vessel ischemic disease. 3. Pansinusitis. 4. Negative head MRA. 5.  Widely patent cervical carotid and vertebral arteries. 6. Possible mild changes of fibromuscular dysplasia in the right cervical ICA. Electronically Signed   By: Logan Bores M.D.   On: 03/15/2020 16:45   MR BRAIN WO CONTRAST  Result Date: 03/15/2020 CLINICAL DATA:  Worsening right eye vision loss. Concern for stroke. EXAM: MR HEAD WITHOUT CONTRAST MR CIRCLE OF WILLIS WITHOUT CONTRAST MRA OF THE NECK WITHOUT AND WITH CONTRAST TECHNIQUE: Multiplanar, multiecho pulse sequences of the brain, circle of willis and surrounding structures were obtained without intravenous contrast. Angiographic images of the neck were obtained using MRA technique without and with intravenous contrast. CONTRAST:   71m GADAVIST GADOBUTROL 1 MMOL/ML IV SOLN COMPARISON:  Head CT 03/14/2020 FINDINGS: MR HEAD FINDINGS Brain: There is no evidence of an acute infarct, intracranial hemorrhage, mass, midline shift, or extra-axial fluid collection. The ventricles and sulci are normal. Small foci of T2 hyperintensity in the cerebral white matter bilaterally are nonspecific but compatible with mild chronic small vessel ischemic disease. No abnormal enhancement is identified. Vascular: Major intracranial vascular flow voids are preserved. Skull and upper cervical spine: Unremarkable bone marrow signal. Sinuses/Orbits: Unremarkable orbits. Widespread mucosal thickening in the paranasal sinuses with complete opacification of the right frontal sinus and right sphenoid sinus and subtotal opacification of the left sphenoid sinus. Bilateral maxillary sinus fluid. Clear mastoid air cells. Other: None. MR CIRCLE OF WILLIS FINDINGS The intracranial vertebral arteries are widely patent to the basilar. The left PICA and right AICA appear dominant. Widely patent SCA origins are seen bilaterally. The basilar artery is widely patent. There are small posterior communicating arteries bilaterally. Both PCAs are patent without evidence of a significant proximal stenosis. The internal carotid arteries are patent from skull base to carotid termini without evidence of a significant stenosis. Artifact is noted in the proximal petrous ICA segments bilaterally. ACAs and MCAs are patent without evidence of a proximal branch occlusion or significant proximal stenosis. No aneurysm is identified. MRA NECK FINDINGS The study is mildly motion degraded. There is a standard 3 vessel aortic arch. The brachiocephalic and subclavian arteries are widely patent. The common carotid and cervical internal carotid arteries are patent bilaterally without evidence of significant stenosis or dissection. There is mild beading of the mid to distal right cervical ICA. The vertebral  arteries are patent and codominant with antegrade flow bilaterally. No significant vertebral artery stenosis or dissection is identified. IMPRESSION: 1. No acute intracranial abnormality. 2. Mild chronic small vessel ischemic disease. 3. Pansinusitis. 4. Negative head MRA. 5. Widely patent cervical carotid and vertebral arteries. 6. Possible mild changes of fibromuscular dysplasia in the right cervical ICA. Electronically Signed   By: ALogan BoresM.D.   On: 03/15/2020 16:45    Procedures Procedures (including critical care time)  Medications Ordered in ED Medications  LORazepam (ATIVAN) injection 1 mg (has no administration in time range)  gadobutrol (GADAVIST) 1 MMOL/ML injection 8 mL (8 mLs Intravenous Contrast Given 03/15/20 1524)    ED Course  I have reviewed the triage vital signs and the nursing notes.  Pertinent labs & imaging results that were available during my care of the patient were reviewed by me and considered in my medical decision making (see chart for details).    MDM Rules/Calculators/A&P  69y.o. F presenting to the ED with acute vision loss of right eye upon waking this AM.  Had some blurred vision the 24 hours PTA.  She saw optho earlier today and sent in for concern of stroke.  These were ordered from triage.  Patient unfortunately with prolonged wait in the lobby of nearly 15 hours.  On my exam, she is AAOx3, no focal strength/sensory deficits noted.  She does have vision loss on right, no blink to threat but EOMs appear intact.  Left eye with central vision loss as well, this is chronic.  2:55 AM Spoke with opthalmology, Dr. Posey Pronto, who evaluated patient earlier today in office-- we have discussed MRI findings that are negative for acute stroke.  States there were no findings on his exam to suggest this is anterior, states it is entirely posterior and feels strongly that patient has had a stroke.  He requested admission for full stroke work-up with neurology consult.   Next step would be for MRI brain and orbits w/contrast.  Discussed with neurology, Dr. Lorrin Goodell-- he will evaluate and admit.  I have also added on ESR and CRP for evaluation of possible temporal arteritis, however patient without headaches/temporal pain.  Further recommendations per neurology.  Discussed with Dr. Hal Hope-- will admit for ongoing care.  3:58 AM Neurology has evaluated-- some concern for GCA.  Will continue with further stroke work-up but will give 1 time dose of steroids now along with ASA.  Will ask optho to reassess her in the AM.  Final Clinical Impression(s) / ED Diagnoses Final diagnoses:  Vision loss of right eye    Rx / DC Orders ED Discharge Orders    None       Larene Pickett, PA-C 03/16/20 [place]    Ripley Fraise, MD 03/16/20 [phone]

## 2020-03-16 NOTE — Consult Note (Signed)
NEUROLOGY CONSULTATION NOTE   Date of service: March 16, 2020 Patient Name: Deborah Marsh MRN:  [id number] DOB:  Aug 08, 1950 Reason for consult: "R eye vision loss"  History of Present Illness  [name] is a 69 y.o. female with PMH significant for DM2, Asthma, HTN who presents for evaluation of R eye vision loss. She woke up on 03/14/20 with R eye blurred vision which she describes as preserved vision but it was dim. She could still read bible. Over the last 48 hours, her vision has deteriorated and now she endorses blindness in the R eye. She denies any R eye pain inpriamry gaze, no eye pain with movement. She has L eye central vision loss since she was 69 years old. No worsening of her vision in the left eye. No fever, no chills. Endorses some rash on her arm that is itchy and endorses spending time outside but no tick bite that she recalls. She has a dog, denies any recent contact with cats. Endorses mom had hx glaucoma, retinal detachment. No prior hx of stroke, no arm or leg weakness, no numbness, no dysarthria, no aphasia. She denies any jaw claudication, no headache, no fever, no prior hx of similar symptoms.  She had workup in the ED with MRI Brain without contrast and MRA head and neck which was negative for any acute abnormality, no LVO.  She was also seen by ophthalmologist on Monday morning and was sent in to the ED. Per discussion with patient and with ED providers, concern for potential stroke but patient was told that her eye exam was normal. She took aspirin 77m after the eye appointment and prior to coming to the ED.   ROS   Constitutional Denies weight loss, fever and chills.  HEENT Denies changes in hearing. Endorses vision loss as noted in HOPI.  Respiratory Denies SOB and cough.  CV Denies palpitations and CP  GI Denies abdominal pain, nausea, vomiting and diarrhea.  GU Denies dysuria and urinary frequency.  MSK Denies myalgia and joint pain.   Skin Denies rash and pruritus.  Neurological Denies headache and syncope.  Psychiatric Denies recent changes in mood. Denies anxiety and depression.   Past History   Past Medical History:  Diagnosis Date  . COPD (chronic obstructive pulmonary disease) (HElfrida   . Diabetes mellitus without complication (HPonshewaing   . Hypertension    History reviewed. No pertinent surgical history. No family history on file. Social History   Socioeconomic History  . Marital status: Married    Spouse name: Not on file  . Number of children: Not on file  . Years of education: Not on file  . Highest education level: Not on file  Occupational History  . Not on file  Tobacco Use  . Smoking status: Never Smoker  . Smokeless tobacco: Never Used  Substance and Sexual Activity  . Alcohol use: Never  . Drug use: Never  . Sexual activity: Not on file  Other Topics Concern  . Not on file  Social History Narrative  . Not on file   Social Determinants of Health   Financial Resource Strain:   . Difficulty of Paying Living Expenses:   Food Insecurity:   . Worried About RCharity fundraiserin the Last Year:   . RArboriculturistin the Last Year:   Transportation Needs:   . LFilm/video editor(Medical):   .Marland KitchenLack of Transportation (Non-Medical):   Physical Activity:   .  Days of Exercise per Week:   . Minutes of Exercise per Session:   Stress:   . Feeling of Stress :   Social Connections:   . Frequency of Communication with Friends and Family:   . Frequency of Social Gatherings with Friends and Family:   . Attends Religious Services:   . Active Member of Clubs or Organizations:   . Attends Archivist Meetings:   Marland Kitchen Marital Status:    No Known Allergies  Medications  (Not in a hospital admission)    Vitals  Temp:  [97.9 F (36.6 C)-98 F (36.7 C)] 98 F (36.7 C) (08/16 1953) Pulse Rate:  [47-80] 47 (08/16 1953) Resp:  [14-20] 16 (08/16 1953) BP: (132-152)/(78-96) 139/78 (08/16  1953) SpO2:  [96 %-98 %] 98 % (08/16 1953)  There is no height or weight on file to calculate BMI.  Physical Exam   General: Laying comfortably in bed; in no acute distress.  HENT: Normal oropharynx and mucosa. Normal external appearance of ears and nose. Neck: Supple, no pain or tenderness CV: No JVD. No peripheral edema. Pulmonary: Symmetric Chest rise. Normal respiratory effort. Abdomen: Soft to touch, non-tender Ext: No cyanosis, edema, or deformity  Skin: No rash. Normal palpation of skin. Musculoskeletal: Normal digits and nails by inspection. No clubbing.  Neurologic Examination  Mental status/Cognition: Alert, oriented to self, place, month and year, good attention. Speech/language: Fluent, comprehension intact, object naming intact, repetition intact. Cranial nerves:   CN II R pupil 63m with RAPD, left pupil 333mand reactive to light.   CN III,IV,VI EOM intact, no gaze preference or deviation, no nystagmus.   CN V normal sensation in V1, V2, and V3 segments bilaterally   CN VII no asymmetry, no nasolabial fold flattening   CN VIII normal hearing to speech   CN IX & X normal palatal elevation, no uvular deviation   CN XI 5/5 head turn and 5/5 shoulder shrug bilaterally   CN XII midline tongue protrusion   Motor:  Muscle bulk: normal, tone normal, pronator drift none Mvmt Root Nerve  Muscle Right Left Comments  SA C5/6 Ax Deltoid 5 5   EF C5/6 Mc Biceps 5 5   EE C6/7/8 Rad Triceps 5 5   WF C6/7 Med FCR 5 5   WE C7/8 PIN ECU 5 5   F Ab C8/T1 U ADM/FDI 5 5   HF L1/2/3 Fem Illopsoas 5 5   KE L2/3/4 Fem Quad 5 5   DF L4/5 D Peron Tib Ant 5 5   PF S1/2 Tibial Grc/Sol 5 5    Reflexes:  Right Left Comments  Pectoralis      Biceps (C5/6) 1 1   Brachioradialis (C5/6) 1 1    Triceps (C6/7) 1 1    Patellar (L3/4) 1 1    Achilles (S1) 1 1    Hoffman      Plantar     Jaw jerk    Sensation:  Light touch Intact throughout   Pin prick    Temperature    Vibration    Proprioception    Coordination/Complex Motor:  - Finger to Nose intact BL - Heel to shin intact BL. - Rapid alternating movement is normal. - Gait: deferred.  Labs   Lab Results  Component Value Date   NA 140 03/15/2020   K 3.6 03/15/2020   CL 100 03/15/2020   CO2 30 03/15/2020   GLUCOSE 95 03/15/2020   BUN 11 03/15/2020  CREATININE 0.92 03/15/2020   CALCIUM 9.8 03/15/2020   ALBUMIN 4.1 03/15/2020   AST 14 (L) 03/15/2020   ALT 16 03/15/2020   ALKPHOS 40 03/15/2020   BILITOT 1.0 03/15/2020   GFRNONAA >60 03/15/2020   GFRAA >60 03/15/2020     Imaging and Diagnostic studies  MRI Brain without contrast and MRA Head and Neck:  IMPRESSION: 1. No acute intracranial abnormality. 2. Mild chronic small vessel ischemic disease. 3. Pansinusitis. 4. Negative head MRA. 5. Widely patent cervical carotid and vertebral arteries. 6. Possible mild changes of fibromuscular dysplasia in the right cervical ICA.  Impression   Ramandeep Norlene Lanes is a 69 y.o. female with PMH significant for DM2, Asthma, HTN who presents for evaluation of R mono occular painless vision loss over 48 hours. Exam with R eye vision loss with RAPD. Differential is broad including CRAO, Giant cell arteritis, Non-arteritic anterior ischemic optic neuropathy due to DM2,  HTN. Also considering optic neuropathy/neuritis specifically of the posterior optic nerve due to inflammatory, infiltrative, compressive vs infectious causes.  She does not have classic features of GCA. However, given significant vision loss, would have low threshold to start IV solumedrol if ESR is elevated.  Her presentation is also atypical for optic neuritis which typically tends to cause painful vision loss and affects much younger patients.  Recommendations  - Agree with MRI Brain with contrast and MR orbits with and without contrast with fat suppression. - Recommend obtaining records from ophthalmology clinic visit in AM and  possibly considering repeat ophthalmologic exam in AM given significant change. - I ordered serum NMO panel, MOG Ab, RPR, serum ACE, B Henselae Ab, Brucella Ab, Lyme Ab. - Agree with STAT ESR and CRP. Low threshold to start IV Solumedrol 1G daily x 5 doses if ESR is elevated and potentially concerning for Giant Cell Arteritis. ______________________________________________________________________   Thank you for the opportunity to take part in the care of this patient. If you have any further questions, please contact the neurology consultation attending.  Signed,  [place] Pager Number [phone]

## 2020-03-16 NOTE — ED Notes (Signed)
Ordered breakfast 

## 2020-03-16 NOTE — Consult Note (Signed)
[facility]    Date of Admission:  03/15/2020      Total days of antibiotics 1          Reason for Consult: Vision Loss with Sinusitus     Referring Provider: Hanley Ben Primary Care Provider: Suzan Slick, MD   Assessment: Deborah Marsh is a 69 y.o. female with sudden onset vision loss to the right eye following what sounds to have been acute bacterial sinusitis. She has declined any surgical intervention at this time and wanting to try 48 hours of IV steroids/antibiotics. Would be helpful to send any sinus cavity material for cultures to guide therapy. Agree with Unasyn IV to cover typical respiratory flora.  She is not diabetic and would not be a person I would be highly concerned with re: fungal involvement.   No need for LP as she does not seem to have any findings c/w meningitis.      Plan: 1. Continue Unasyn  2. Re-evaluate with medical management alone per patient request 3. If ENT were to drain - please send for routine and fungal cultures     Principal Problem:   Vision loss of right eye Active Problems:   Essential hypertension   COPD (chronic obstructive pulmonary disease) (HCC)    aspirin  81 mg Oral Daily   fluticasone  1 spray Each Nare Daily   losartan  25 mg Oral Daily    HPI: Deborah Marsh is a 69 y.o. female sent to the ER for evaluation of sudden onset vision loss in the right eye.   PMHx HTN, COPD, pre-diabetes.   She states that a few months ago she had sinusitus symptoms - was given a prescription for "azithromycin + prednisone" and cleared up in 3 days. During this period she was experiencing facial tenderness over forehead and under eyes but no fevers. Seemed to be doing pretty well after that. Also felt to have had some bronchitis flare up then.   Last Friday she experienced fairly sudden onset vision loss to the right eye. No associated fevers, chills, headaches, neck pain, ear  pain or eye pain. She has had no changes in the vision of her left eye. She came to the ER on 8/15 complaining of blurry vision - head CT not revealing at that time and outpatient referral was made to ophthalmology. She was then referred for MRI after her problem worsened and nothing to explain. MRI here in our ER reveals swelling to the right optic nerve and opacified sinuses. ENT has seen her and offered drainage but she declined. Also declined further evaluation with LP to assess for meningitis. States that since she has arrived and given high dose steroids she can see a little light out of the top field of her vision.    Review of Systems: Review of Systems  Constitutional: Negative for chills, fever, malaise/fatigue and weight loss.  HENT: Positive for congestion. Negative for ear pain, hearing loss and sore throat.   Eyes:       Vision loss right eye   Respiratory: Negative for cough and shortness of breath.   Cardiovascular: Negative for chest pain and leg swelling.  Gastrointestinal: Negative for abdominal pain, diarrhea and vomiting.  Genitourinary: Negative for dysuria.  Musculoskeletal: Negative for back pain, joint pain and neck pain.  Skin: Negative for rash.  Neurological: Negative for dizziness, weakness and headaches.    Past Medical History:  Diagnosis Date   COPD (chronic obstructive pulmonary disease) (HCC)    Hypertension     Social History   Tobacco Use   Smoking status: Former Smoker   Smokeless tobacco: Never Used  Substance Use Topics   Alcohol use: Never   Drug use: Never    Family History  Problem Relation Age of Onset   Glaucoma Mother    No Known Allergies  OBJECTIVE: Blood pressure 120/67, pulse 65, temperature 98 F (36.7 C), temperature source Oral, resp. rate 18, SpO2 98 %.  Physical Exam Constitutional:      General: She is not in acute distress.    Appearance: Normal appearance.     Comments: Sitting in hallway stretcher in ER.    HENT:     Mouth/Throat:     Mouth: Mucous membranes are moist.     Pharynx: Oropharynx is clear.  Eyes:     General: Lids are normal. Visual field deficit present. No scleral icterus.    Extraocular Movements: Extraocular movements intact.     Conjunctiva/sclera: Conjunctivae normal.     Pupils: Pupils are equal, round, and reactive to light.     Comments: Does not really blink to threat on the right. EOM seems intact and non-painful.   Cardiovascular:     Rate and Rhythm: Normal rate and regular rhythm.     Pulses: Normal pulses.     Heart sounds: No murmur heard.   Pulmonary:     Effort: Pulmonary effort is normal.     Breath sounds: Normal breath sounds.  Abdominal:     General: Bowel sounds are normal. There is no distension.     Palpations: Abdomen is soft.     Tenderness: There is no abdominal tenderness.  Musculoskeletal:     Cervical back: Normal range of motion. No rigidity or tenderness.  Skin:    General: Skin is warm and dry.     Capillary Refill: Capillary refill takes less than 2 seconds.  Neurological:     Mental Status: She is alert and oriented to person, place, and time.     Lab Results Lab Results  Component Value Date   WBC 9.1 03/15/2020   HGB 14.9 03/15/2020   HCT 45.3 03/15/2020   MCV 86.9 03/15/2020   PLT 272 03/15/2020    Lab Results  Component Value Date   CREATININE 0.92 03/15/2020   BUN 11 03/15/2020   NA 140 03/15/2020   K 3.6 03/15/2020   CL 100 03/15/2020   CO2 30 03/15/2020    Lab Results  Component Value Date   ALT 16 03/15/2020   AST 14 (L) 03/15/2020   ALKPHOS 40 03/15/2020   BILITOT 1.0 03/15/2020     Microbiology: Recent Results (from the past 240 hour(s))  SARS Coronavirus 2 by RT PCR (hospital order, performed in [facility] hospital lab) Nasopharyngeal Nasopharyngeal Swab     Status: None   Collection Time: 03/16/20  3:39 AM   Specimen: Nasopharyngeal Swab  Result Value Ref Range Status   SARS Coronavirus 2  NEGATIVE NEGATIVE Final    Comment: (NOTE) SARS-CoV-2 target nucleic acids are NOT DETECTED.  The SARS-CoV-2 RNA is generally detectable in upper and lower respiratory specimens during the acute phase of infection. The lowest concentration of SARS-CoV-2 viral copies this assay can detect is 250 copies / mL. A negative result does not preclude SARS-CoV-2 infection and should not be used as the sole basis for treatment or other patient management decisions.  A negative result may occur with improper specimen collection / handling, submission of specimen other than nasopharyngeal swab, presence of viral mutation(s) within the areas targeted by this assay, and inadequate number of viral copies (<250 copies / mL). A negative result must be combined with clinical observations, patient history, and epidemiological information.  Fact Sheet for Patients:   BoilerBrush.com.cy  Fact Sheet for Healthcare Providers: https://pope.com/  This test is not yet approved or  cleared by the [place] FDA and has been authorized for detection and/or diagnosis of SARS-CoV-2 by FDA under an Emergency Use Authorization (EUA).  This EUA will remain in effect (meaning this test can be used) for the duration of the COVID-19 declaration under Section 564(b)(1) of the Act, 21 U.S.C. section 360bbb-3(b)(1), unless the authorization is terminated or revoked sooner.  Performed at [facility], [address], [place], [place] [address]     Rexene Alberts, MSN, NP-C [facility]  [email or web address] Pager: [phone] Office: [phone] [facility] Main Line: [phone]

## 2020-03-16 NOTE — Progress Notes (Signed)
Occupational Therapy Evaluation Patient Details Name: Deborah Marsh MRN: [id number] DOB: 1951/07/22 Today's Date: 03/16/2020    History of Present Illness 69 y.o. female with history of hypertension, COPD, prediabetes previous history of tobacco abuse quit 4 years ago recently treated for COPD exacerbation with steroids and antibiotics presents to the ER because of poor vision in the right eye. Patient has history of left eye central vision loss since age 74. Patient's right blurry vision gradually worsened and presented to the ER at [facility] on August 15th 2021 CT head was done which was unremarkable and was referred to ophthalmologist.  Patient went to the ophthalmologist on March 15, 2020 and was advised to come to the ER for further work-up as per the report from the ER physician ophthalmologist did not see any cause for the right eye vision loss.  By now patient has complete loss of vision on the right eye.    Clinical Impression   PTA pt lived with her husband and was independent with ADL, mobility and IADL tasks, including driving. Pt reports total loss of vision R eye and chronic loss of central vision L eye. Pt able to mobilize with HHA due to unfamiliar environment. Able to complete ADL tasks, however is unable to complete IADL tasks at this time. Pt gien contact information for [facility] to assist with assistive devices to use within the home. Recommend HHOT to maximize functional level of independence with IADL tasks and reduce risk of falls. Will follow acutely.     Follow Up Recommendations  Home health OT;Supervision - Intermittent    Equipment Recommendations  3 in 1 bedside commode (to use as shower seat)    Recommendations for Other Services Other (comment) ([facility])     Precautions / Restrictions Precautions Precautions: Fall      Mobility Bed Mobility                  Transfers Overall transfer level: Modified  independent                    Balance Overall balance assessment: Mild deficits observed, not formally tested                                         ADL either performed or assessed with clinical judgement   ADL                                         General ADL Comments: able to complete basic ADL tasks with set up - knows where objects are at home adn can function with bathing/dressing. Unable to complete IADL tasks at this time due to vision loss.      Vision Baseline Vision/History: Wears glasses Wears Glasses: At all times Vision Assessment?: Yes Visual Fields: Right visual field deficit;Left visual field deficit Depth Perception: Undershoots;Overshoots Additional Comments: R eye blindness; L eye - no central vision     Perception     Praxis      Pertinent Vitals/Pain Pain Assessment: No/denies pain     Hand Dominance Right   Extremity/Trunk Assessment Upper Extremity Assessment Upper Extremity Assessment: Overall WFL for tasks assessed   Lower Extremity Assessment Lower Extremity Assessment: Overall WFL for tasks assessed  Cervical / Trunk Assessment Cervical / Trunk Assessment: Normal   Communication Communication Communication: No difficulties   Cognition Arousal/Alertness: Awake/alert Behavior During Therapy: WFL for tasks assessed/performed Overall Cognitive Status: Within Functional Limits for tasks assessed                                     General Comments       Exercises     Shoulder Instructions      Home Living Family/patient expects to be discharged to:: Private residence Living Arrangements: Spouse/significant other Available Help at Discharge: Family;Available 24 hours/day (husband; drives) Type of Home: House Home Access: Stairs to enter [employer] of Steps: 5 Entrance Stairs-Rails: Right;Left;Can reach both Home Layout: One level     Bathroom  Shower/Tub: Tub/shower unit;Walk-in shower   Bathroom Toilet: Standard Bathroom Accessibility: Yes How Accessible: Accessible via walker Home Equipment: None          Prior Functioning/Environment Level of Independence: Independent        Comments: drove; dod her own medication adn financial management        OT Problem List: Impaired vision/perception;Decreased knowledge of use of DME or AE      OT Treatment/Interventions: Self-care/ADL training;DME and/or AE instruction;Visual/perceptual remediation/compensation;Patient/family education    OT Goals(Current goals can be found in the care plan section) Acute Rehab OT Goals Patient Stated Goal: to have her vision return OT Goal Formulation: With patient Time For Goal Achievement: 03/30/20 Potential to Achieve Goals: Good  OT Frequency: Min 2X/week   Barriers to D/C:            Co-evaluation              AM-PAC OT "6 Clicks" Daily Activity     Outcome Measure Help from another person eating meals?: A Little Help from another person taking care of personal grooming?: A Little Help from another person toileting, which includes using toliet, bedpan, or urinal?: A Little Help from another person bathing (including washing, rinsing, drying)?: A Little Help from another person to put on and taking off regular upper body clothing?: A Little Help from another person to put on and taking off regular lower body clothing?: A Little 6 Click Score: 18   End of Session Equipment Utilized During Treatment: Rolling walker Nurse Communication: Mobility status  Activity Tolerance: Patient tolerated treatment well Patient left: in bed;with family/visitor present  OT Visit Diagnosis: Low vision, both eyes (H54.2)                Time: 0940-1002 OT Time Calculation (min): 22 min Charges:  OT General Charges $OT Visit: 1 Visit OT Evaluation $OT Eval Low Complexity: 1 Low  Maleyah Evans, OT/L   Acute OT Clinical  Specialist Acute Rehabilitation Services Pager [phone] Office [phone]   [facility] 03/16/2020, 10:16 AM

## 2020-03-16 NOTE — Progress Notes (Signed)
She feels that her vision has improved slightly. She has light perception only in the right eye, with some reaction, but a significant APD. MRI orbits with sinusitis and inflammation near the optic nerve. She denies headache or any other signs of menigitis. ENT suspects reactive vasculitis instead of direct infectious spread and this seems like the most likely culprit to me as well given lack of CNS symptoms. Appreciate ID assessment.   She has been started on high dose steroids and I would conitnue steroids. For idiopathic inflamatory optic neuritis, I would typically do 5 days of 1g daily, but this is a different situation. I do think high doses to try to save vision in her one good eye as much as possible would be prudent, as for whether to continue steroids after a pulse I am less clear on. Will follow.   Ritta Slot, MD [facility] [phone]  If 7pm- 7am, please page neurology on call as listed in AMION.

## 2020-03-16 NOTE — Progress Notes (Signed)
Pt has been transferred to the unit via hopsital bed. All IVs are intact. Pt belongings and equipment are sent them. Dinner is process of being ordered. Belongings are sent with patient. Telephone and call light are within reach

## 2020-03-16 NOTE — ED Provider Notes (Signed)
ED ECG REPORT   Date: 03/16/2020 0555  Rate: 51  Rhythm: sinus bradycardia  QRS Axis: normal  Intervals: normal  ST/T Wave abnormalities: t wave inversion  Conduction Disutrbances:none  Narrative Interpretation:   Old EKG Reviewed: none available  I have personally reviewed the EKG tracing and agree with the computerized printout as noted.    Zadie Rhine, MD 03/16/20 [phone]

## 2020-03-16 NOTE — Progress Notes (Signed)
Patient ID: Deborah Marsh, female   DOB: [birth year], 69 y.o.   MRN: [id number] Patient admitted early this morning for right eye vision lossand neurology has been consulted.  Patient seen and examined at bedside. Plan of care discussed with her and granddaughter at bedside. I have reviewed patient medical records including this morning's H&P, vitals, labs and medications myself. I spoke to Dr. Kirkpatrick/neurology who also had recommended ophthalmology evaluation. I spoke to Dr. Patel/ophthalmology on phone who stated that he had done a full ophthalmological evaluation in his office on 03/15/2020 and stated that patient does not need inpatient ophthalmological evaluation at this time. I discussed the MRI orbit finding with Dr. Posey Pronto including the finding of swelling in the right optic nerve along the optic canal along with sinusitis. He stated that he would speak to the neurologist on phone. Subsequently, I was called by Dr. Kirkpatrick/neurology who had spoken to the ophthalmologist and subsequently suggested that we start the patient on high-dose Solu-Medrol. I was also told that ENT be consulted as per ophthalmology recommendations. I then spoke to Dr. Verita Schneiders and requested consult. Dr. Janace Hoard has agreed to see the patient in consultation. I have switched Rocephin to Unasyn. CRP and ESR are low hence giant cell arteritis is probably not the cause of her symptoms. Repeat a.m. labs.

## 2020-03-16 NOTE — Evaluation (Addendum)
Physical Therapy Evaluation and Discharge Patient Details Name: Deborah Marsh MRN: [id number] DOB: 03/07/1951 Today's Date: 03/16/2020   History of Present Illness  69 y.o. female with history of hypertension, COPD, prediabetes previous history of tobacco abuse quit 4 years ago recently treated for COPD exacerbation with steroids and antibiotics presents to the ER because of poor vision in the right eye. Patient has history of left eye central vision loss since age 53. Patient's right blurry vision gradually worsened and presented to the ER at [facility] on August 15th 2021 CT head was done which was unremarkable and was referred to ophthalmologist.  Patient went to the ophthalmologist on March 15, 2020 and was advised to come to the ER for further work-up as per the report from the ER physician ophthalmologist did not see any cause for the right eye vision loss.  By now patient has complete loss of vision on the right eye.   Clinical Impression  Pt admitted secondary to problem above with deficits below. Pt requiring supervision for mobility tasks within the ED. Pt was able to see peripherally out of L eye, but otherwise, only able to see shadows in the hall. Educated about ways to help with depth perception, especially with stair navigation. Feel further PT needs can be addressed with HHPT. Pt also reports she feels she does not need further PT while in the hospital. Will sign off. If needs change, please re-consult.    Follow Up Recommendations Home health PT    Equipment Recommendations  None recommended by PT    Recommendations for Other Services       Precautions / Restrictions Precautions Precautions: Fall Restrictions Weight Bearing Restrictions: No      Mobility  Bed Mobility Overal bed mobility: Modified Independent                Transfers Overall transfer level: Modified independent                  Ambulation/Gait Ambulation/Gait assistance:  Supervision Gait Distance (Feet): 100 Feet Assistive device: None Gait Pattern/deviations: Step-through pattern;Decreased stride length Gait velocity: Decreased   General Gait Details: Supervision for safety. Able to perform horizontal and vertical head turns without LOB.   Stairs Stairs: Yes       General stair comments: Verbally educated about putting tape on steps to help with safety with stair management. Also to help with depth perception. Pt able to march in place to simulate stair navigation in ED  Wheelchair Mobility    Modified Rankin (Stroke Patients Only)       Balance Overall balance assessment: Mild deficits observed, not formally tested                                           Pertinent Vitals/Pain      Home Living Family/patient expects to be discharged to:: Private residence Living Arrangements: Spouse/significant other Available Help at Discharge: Family;Available 24 hours/day (husband; drives) Type of Home: House Home Access: Stairs to enter Entrance Stairs-Rails: Right;Left;Can reach both Entrance Stairs-Number of Steps: 5 Home Layout: One level Home Equipment: None      Prior Function Level of Independence: Independent         Comments: drove; did her own medication and financial management     Hand Dominance   Dominant Hand: Right    Extremity/Trunk Assessment  Upper Extremity Assessment Upper Extremity Assessment: Defer to OT evaluation    Lower Extremity Assessment Lower Extremity Assessment: Overall WFL for tasks assessed    Cervical / Trunk Assessment Cervical / Trunk Assessment: Normal  Communication   Communication: No difficulties  Cognition Arousal/Alertness: Awake/alert Behavior During Therapy: WFL for tasks assessed/performed Overall Cognitive Status: Within Functional Limits for tasks assessed                                        General Comments      Exercises      Assessment/Plan    PT Assessment Patent does not need any further PT services  PT Problem List         PT Treatment Interventions      PT Goals (Current goals can be found in the Care Plan section)  Acute Rehab PT Goals Patient Stated Goal: to have her vision return PT Goal Formulation: With patient Time For Goal Achievement: 03/16/20 Potential to Achieve Goals: Good    Frequency     Barriers to discharge        Co-evaluation               AM-PAC PT "6 Clicks" Mobility  Outcome Measure Help needed turning from your back to your side while in a flat bed without using bedrails?: None Help needed moving from lying on your back to sitting on the side of a flat bed without using bedrails?: None Help needed moving to and from a bed to a chair (including a wheelchair)?: None Help needed standing up from a chair using your arms (e.g., wheelchair or bedside chair)?: None Help needed to walk in hospital room?: None Help needed climbing 3-5 steps with a railing? : A Little 6 Click Score: 23    End of Session   Activity Tolerance: Patient tolerated treatment well Patient left: in bed;with call bell/phone within reach (on stretcher in ED ) Nurse Communication: Mobility status PT Visit Diagnosis: Other abnormalities of gait and mobility (R26.89)    Time: 5188-4166 PT Time Calculation (min) (ACUTE ONLY): 10 min   Charges:   PT Evaluation $PT Eval Low Complexity: 1 Low          Cindee Salt, DPT  Acute Rehabilitation Services  Pager: [phone] Office: [phone]   Deborah Marsh 03/16/2020, 1:56 PM

## 2020-03-17 DIAGNOSIS — J449 Chronic obstructive pulmonary disease, unspecified: Secondary | ICD-10-CM

## 2020-03-17 DIAGNOSIS — I10 Essential (primary) hypertension: Secondary | ICD-10-CM

## 2020-03-17 LAB — CBC WITH DIFFERENTIAL/PLATELET
Abs Immature Granulocytes: 0.05 10*3/uL (ref 0.00–0.07)
Basophils Absolute: 0 10*3/uL (ref 0.0–0.1)
Basophils Relative: 0 %
Eosinophils Absolute: 0 10*3/uL (ref 0.0–0.5)
Eosinophils Relative: 0 %
HCT: 40.7 % (ref 36.0–46.0)
Hemoglobin: 14.3 g/dL (ref 12.0–15.0)
Immature Granulocytes: 1 %
Lymphocytes Relative: 5 %
Lymphs Abs: 0.5 10*3/uL — ABNORMAL LOW (ref 0.7–4.0)
MCH: 30 pg (ref 26.0–34.0)
MCHC: 35.1 g/dL (ref 30.0–36.0)
MCV: 85.3 fL (ref 80.0–100.0)
Monocytes Absolute: 0.1 10*3/uL (ref 0.1–1.0)
Monocytes Relative: 1 %
Neutro Abs: 9.1 10*3/uL — ABNORMAL HIGH (ref 1.7–7.7)
Neutrophils Relative %: 93 %
Platelets: 278 10*3/uL (ref 150–400)
RBC: 4.77 MIL/uL (ref 3.87–5.11)
RDW: 13.3 % (ref 11.5–15.5)
WBC: 9.8 10*3/uL (ref 4.0–10.5)
nRBC: 0 % (ref 0.0–0.2)

## 2020-03-17 LAB — BASIC METABOLIC PANEL
Anion gap: 8 (ref 5–15)
BUN: 11 mg/dL (ref 8–23)
CO2: 26 mmol/L (ref 22–32)
Calcium: 8.9 mg/dL (ref 8.9–10.3)
Chloride: 103 mmol/L (ref 98–111)
Creatinine, Ser: 0.92 mg/dL (ref 0.44–1.00)
GFR calc Af Amer: >60 mL/min (ref >60)
GFR calc non Af Amer: >60 mL/min (ref >60)
Glucose, Bld: 164 mg/dL — ABNORMAL HIGH (ref 70–99)
Potassium: 3.9 mmol/L (ref 3.5–5.1)
Sodium: 137 mmol/L (ref 135–145)

## 2020-03-17 LAB — BARTONELLA ANTIBODY PANEL
B Quintana IgM: NEGATIVE titer
B henselae IgG: NEGATIVE titer
B henselae IgM: NEGATIVE titer
B quintana IgG: NEGATIVE titer

## 2020-03-17 LAB — MAGNESIUM: Magnesium: 2 mg/dL (ref 1.7–2.4)

## 2020-03-17 LAB — ANGIOTENSIN CONVERTING ENZYME: Angiotensin-Converting Enzyme: 24 U/L (ref 14–82)

## 2020-03-17 LAB — URINE CULTURE

## 2020-03-17 LAB — NEUROMYELITIS OPTICA AUTOAB, IGG: NMO-IgG: <1.5 U/mL (ref 0.0–3.0)

## 2020-03-17 LAB — B. BURGDORFI ANTIBODIES: B burgdorferi Ab IgG+IgM: <0.91 {ISR} (ref 0.00–0.90)

## 2020-03-17 MED ORDER — BISACODYL 10 MG RE SUPP
10.0000 mg | Freq: Once | RECTAL | Status: AC
  Administered 2020-03-17: 10 mg via RECTAL
  Filled 2020-03-17: qty 1

## 2020-03-17 NOTE — Progress Notes (Signed)
R Pupil is slightly less reactive today and she has no light perception. Unfortunately, from a medical perspective I do not think we really have anything else to offer other than what we are already doing. I would continue steroids to try to reduce inflammation as well as antibiotics. Neurology will follow.   Ritta Slot, MD [facility] [phone]  If 7pm- 7am, please page neurology on call as listed in AMION.

## 2020-03-17 NOTE — TOC Initial Note (Signed)
Transition of Care [facility]) - Initial/Assessment Note    Patient Details  Name: Deborah Marsh MRN: [id number] Date of Birth: 02/27/1951  [facility]) CM/SW Contact:    Kermit Balo, RN Phone Number: 03/17/2020, 3:45 PM  Clinical Narrative:                 Pt lives at home with spouse and Granddaughter. Recommendations for [facility] services. Pt unsure of who she wants and asked that I reach out to her Granddaughter. CM called Granddaughter and [facility] selected. Cory with [facility] accepted the referral.  ToC following for further d/c needs.    Expected Discharge Plan: Home w Home Health Services Barriers to Discharge: Continued Medical Work up   Patient Goals and CMS Choice   CMS Medicare.gov Compare Post Acute Care list provided to:: Patient Represenative (must comment) (Granddaughter) Choice offered to / list presented to : Patient (Granddaughter)  Expected Discharge Plan and Services Expected Discharge Plan: Home w Home Health Services   Discharge Planning Services: CM Consult Post Acute Care Choice: Home Health Living arrangements for the past 2 months: Single Family Home                           HH Arranged: PT, OT HH Agency: [facility] Date [facility] Agency Contacted: 03/17/20   Representative spoke with at [facility] Agency: Kandee Keen  Prior Living Arrangements/Services Living arrangements for the past 2 months: Single Family Home Lives with:: Spouse, Relatives Patient language and need for interpreter reviewed:: Yes Do you feel safe going back to the place where you live?: Yes      Need for Family Participation in Patient Care: Yes (Comment) Care giver support system in place?: Yes (comment)   Criminal Activity/Legal Involvement Pertinent to Current Situation/Hospitalization: No - Comment as needed  Activities of Daily Living      Permission Sought/Granted                  Emotional Assessment Appearance:: Appears stated  age Attitude/Demeanor/Rapport: Engaged Affect (typically observed): Accepting Orientation: : Oriented to Self, Oriented to Place, Oriented to  Time, Oriented to Situation   Psych Involvement: No (comment)  Admission diagnosis:  Vision loss [H54.7] Vision loss of right eye [H54.61] Patient Active Problem List   Diagnosis Date Noted  . Vision loss of right eye 03/16/2020  . Essential hypertension 03/16/2020  . COPD (chronic obstructive pulmonary disease) (HCC) 03/16/2020   PCP:  Suzan Slick, MD Pharmacy:   [facility] [address] [place] - [address] [place] [address] Phone: [phone] Fax: [phone]     Social Determinants of Health (SDOH) Interventions    Readmission Risk Interventions No flowsheet data found.

## 2020-03-17 NOTE — Progress Notes (Signed)
  Speech Language Pathology  Patient Details Name: Deborah Marsh MRN: [id number] DOB: 1950/08/07 Today's Date: 03/17/2020 Time:  -       Spoke with pt- informal screen- no need for formal assessment  Royce Macadamia M.Ed Nurse, children's [phone] Office [phone]

## 2020-03-17 NOTE — Progress Notes (Signed)
Subjective/Chief Complaint: She is feeling great except no sight in right eye   Objective: Vital signs in last 24 hours: Temp:  [97.5 F (36.4 C)-98.4 F (36.9 C)] 98.2 F (36.8 C) (08/18 1508) Pulse Rate:  [52-78] 56 (08/18 1508) Resp:  [16-20] 20 (08/18 1508) BP: (121-141)/(66-79) 121/68 (08/18 1508) SpO2:  [94 %-100 %] 95 % (08/18 1508) Last BM Date: 03/14/20  Intake/Output from previous day: 08/17 0701 - 08/18 0700 In: 1763 [I.V.:1444; IV Piggyback:319.1] Out: -  Intake/Output this shift: No intake/output data recorded.  no new finding. no swelling or external changes  Lab Results:  Recent Labs    03/15/20 1325 03/17/20 0159  WBC 9.1 9.8  HGB 14.9 14.3  HCT 45.3 40.7  PLT 272 278   BMET Recent Labs    03/15/20 1325 03/17/20 0159  NA 140 137  K 3.6 3.9  CL 100 103  CO2 30 26  GLUCOSE 95 164*  BUN 11 11  CREATININE 0.92 0.92  CALCIUM 9.8 8.9   PT/INR Recent Labs    03/15/20 1325  LABPROT 13.6  INR 1.1   ABG No results for input(s): PHART, HCO3 in the last 72 hours.  Invalid input(s): PCO2, PO2  Studies/Results: MR BRAIN W CONTRAST  Addendum Date: 03/16/2020   ADDENDUM REPORT: 03/16/2020 09:01 ADDENDUM: These results were called by telephone at the time of interpretation on 03/16/2020 at 9:00 am to provider Hanley Ben, who verbally acknowledged these results. Electronically Signed   By: Marlan Palau M.D.   On: 03/16/2020 09:01   Result Date: 03/16/2020 CLINICAL DATA:  Right eye vision loss EXAM: MRI HEAD WITH CONTRAST MRI ORBITS WITHOUT AND WITH CONTRAST TECHNIQUE: Multiplanar, multiecho pulse sequences of the brain and surrounding structures were obtained with intravenous contrast. Multiplanar, multiecho pulse sequences of the orbits and surrounding structures were obtained including fat saturation techniques, before and after intravenous contrast administration. CONTRAST:  51mL GADAVIST GADOBUTROL 1 MMOL/ML IV SOLN COMPARISON:  MRI head without  with contrast 03/15/2020 FINDINGS: MRI HEAD FINDINGS There is mild dural thickening and enhancement along the planum sphenoidale on the right. This is unchanged from yesterday. Remainder of the enhancement of the brain and surrounding structures is normal. There is extensive associated sinus mucosal disease throughout the paranasal sinuses including the sphenoid sinus bilaterally. See additional discussion below. Pituitary enhances normally. Infundibulum midline. No enhancing skeletal lesion. MRI ORBITS FINDINGS Orbits: Globe is symmetric and normal bilaterally. No orbital mass or edema. There is dural thickening and enhancement along the planum sphenoidale on the right. See axial postcontrast image 10 and coronal postcontrast image 9. This is in the region of the optic canal. There appears to be some swelling of the optic nerve within the optic canal with surrounding enhancement, series 18, image 20. There is extensive mucosal edema in the adjacent sphenoid sinus and this may represent inflammatory or infectious process. Remainder of the optic nerves are normal. Pituitary normal.  Cavernous sinus normal. Visualized sinuses: Extensive mucosal edema throughout the paranasal sinuses. Air-fluid levels in the maxillary sinus bilaterally. Soft tissues: No soft tissue mass or edema. IMPRESSION: Dural thickening along the planum sphenoidale on the right. There appears to be edema in the right optic nerve extending through the optic canal with surrounding enhancement suggestive of inflammatory or infectious process likely related to sinusitis. Tumor not likely. No enhancement of the adjacent brain. No brain abscess. Electronically Signed: By: Marlan Palau M.D. On: 03/16/2020 08:57   MR ORBITS W WO CONTRAST  Addendum  Date: 03/16/2020   ADDENDUM REPORT: 03/16/2020 09:01 ADDENDUM: These results were called by telephone at the time of interpretation on 03/16/2020 at 9:00 am to provider Hanley Ben, who verbally acknowledged  these results. Electronically Signed   By: Marlan Palau M.D.   On: 03/16/2020 09:01   Result Date: 03/16/2020 CLINICAL DATA:  Right eye vision loss EXAM: MRI HEAD WITH CONTRAST MRI ORBITS WITHOUT AND WITH CONTRAST TECHNIQUE: Multiplanar, multiecho pulse sequences of the brain and surrounding structures were obtained with intravenous contrast. Multiplanar, multiecho pulse sequences of the orbits and surrounding structures were obtained including fat saturation techniques, before and after intravenous contrast administration. CONTRAST:  68mL GADAVIST GADOBUTROL 1 MMOL/ML IV SOLN COMPARISON:  MRI head without with contrast 03/15/2020 FINDINGS: MRI HEAD FINDINGS There is mild dural thickening and enhancement along the planum sphenoidale on the right. This is unchanged from yesterday. Remainder of the enhancement of the brain and surrounding structures is normal. There is extensive associated sinus mucosal disease throughout the paranasal sinuses including the sphenoid sinus bilaterally. See additional discussion below. Pituitary enhances normally. Infundibulum midline. No enhancing skeletal lesion. MRI ORBITS FINDINGS Orbits: Globe is symmetric and normal bilaterally. No orbital mass or edema. There is dural thickening and enhancement along the planum sphenoidale on the right. See axial postcontrast image 10 and coronal postcontrast image 9. This is in the region of the optic canal. There appears to be some swelling of the optic nerve within the optic canal with surrounding enhancement, series 18, image 20. There is extensive mucosal edema in the adjacent sphenoid sinus and this may represent inflammatory or infectious process. Remainder of the optic nerves are normal. Pituitary normal.  Cavernous sinus normal. Visualized sinuses: Extensive mucosal edema throughout the paranasal sinuses. Air-fluid levels in the maxillary sinus bilaterally. Soft tissues: No soft tissue mass or edema. IMPRESSION: Dural thickening along  the planum sphenoidale on the right. There appears to be edema in the right optic nerve extending through the optic canal with surrounding enhancement suggestive of inflammatory or infectious process likely related to sinusitis. Tumor not likely. No enhancement of the adjacent brain. No brain abscess. Electronically Signed: By: Marlan Palau M.D. On: 03/16/2020 08:57    Anti-infectives: Anti-infectives (From admission, onward)   Start     Dose/Rate Route Frequency Ordered Stop   03/16/20 0930  Ampicillin-Sulbactam (UNASYN) 3 g in sodium chloride 0.9 % 100 mL IVPB     Discontinue     3 g 200 mL/hr over 30 Minutes Intravenous Every 6 hours 03/16/20 0923     03/16/20 0615  cefTRIAXone (ROCEPHIN) 1 g in sodium chloride 0.9 % 100 mL IVPB  Status:  Discontinued        1 g 200 mL/hr over 30 Minutes Intravenous Daily 03/16/20 0547 03/16/20 0923      Assessment/Plan: s/p * No surgery found * she still has no interest and refuses surgery after discussion again about the sinusitis and the vision loss and decompressing the sinus. Continue medical therapy and will need follow up in few weeks  to reassess the sinus for persistent disease. Should be on 2 weeks of antibiotics  LOS: 1 day    Suzanna Obey 03/17/2020

## 2020-03-17 NOTE — Plan of Care (Signed)

## 2020-03-17 NOTE — Progress Notes (Signed)
[facility]  Date of Admission:  03/15/2020      Total days of antibiotics 2  Unasyn           ASSESSMENT: Deborah Marsh is a 69 y.o. female with acute vision loss in the setting of sinusitis and optic nerve swelling without abscess identified on brain imaging. Would continue IV Unasyn with recs from ENT/Neurology re: steroids given concern over possible vasculitis related inflammation effect.    PLAN: 1. Continue IV unasyn  2. Follow for improvement   Principal Problem:   Vision loss of right eye Active Problems:   Essential hypertension   COPD (chronic obstructive pulmonary disease) (HCC)    aspirin  81 mg Oral Daily   fluticasone  1 spray Each Nare Daily   losartan  25 mg Oral Daily   senna-docusate  1 tablet Oral BID    SUBJECTIVE: She is frustrated today that her vision may not come back. "Can't promise anything with surgery so why bother"  No fevers/chills/antibiotic side effects.   Review of Systems: Review of Systems  Constitutional: Negative for chills and fever.  HENT: Negative for tinnitus.   Eyes: Negative for blurred vision and photophobia.       Absent vision R eye.   Respiratory: Negative for cough and sputum production.   Cardiovascular: Negative for chest pain.  Gastrointestinal: Negative for diarrhea, nausea and vomiting.  Genitourinary: Negative for dysuria.  Skin: Negative for rash.  Neurological: Negative for headaches.    No Known Allergies  OBJECTIVE: Vitals:   03/16/20 2329 03/17/20 0346 03/17/20 0836 03/17/20 1137  BP: 138/73 126/66 140/79 (!) 141/77  Pulse: 78 65 (!) 52 (!) 53  Resp: 16 18 18 18   Temp: 97.7 F (36.5 C) (!) 97.5 F (36.4 C) 98.4 F (36.9 C) 98 F (36.7 C)  TempSrc: Oral Oral Oral Oral  SpO2: 94% 95% 98% 100%   There is no height or weight on file to calculate BMI.  Physical Exam Vitals reviewed.  Constitutional:      Appearance: Normal appearance. She  is not ill-appearing.  HENT:     Mouth/Throat:     Mouth: Mucous membranes are moist.     Pharynx: Oropharynx is clear.  Eyes:     General: No scleral icterus.    Comments: Sluggish R pupil   Pulmonary:     Effort: Pulmonary effort is normal.  Neurological:     Mental Status: She is oriented to person, place, and time.  Psychiatric:        Mood and Affect: Mood normal.        Thought Content: Thought content normal.     Lab Results Lab Results  Component Value Date   WBC 9.8 03/17/2020   HGB 14.3 03/17/2020   HCT 40.7 03/17/2020   MCV 85.3 03/17/2020   PLT 278 03/17/2020    Lab Results  Component Value Date   CREATININE 0.92 03/17/2020   BUN 11 03/17/2020   NA 137 03/17/2020   K 3.9 03/17/2020   CL 103 03/17/2020   CO2 26 03/17/2020    Lab Results  Component Value Date   ALT 16 03/15/2020   AST 14 (L) 03/15/2020   ALKPHOS 40 03/15/2020   BILITOT 1.0 03/15/2020     Microbiology: Recent Results (from the past 240 hour(s))  Culture, Urine     Status: Abnormal   Collection Time: 03/15/20  8:00 PM  Specimen: Urine, Random  Result Value Ref Range Status   Specimen Description URINE, RANDOM  Final   Special Requests   Final    NONE Performed at [facility], [address], [place], [place] [address]    Culture MULTIPLE SPECIES PRESENT, SUGGEST RECOLLECTION (A)  Final   Report Status 03/17/2020 FINAL  Final  SARS Coronavirus 2 by RT PCR (hospital order, performed in [facility] hospital lab) Nasopharyngeal Nasopharyngeal Swab     Status: None   Collection Time: 03/16/20  3:39 AM   Specimen: Nasopharyngeal Swab  Result Value Ref Range Status   SARS Coronavirus 2 NEGATIVE NEGATIVE Final    Comment: (NOTE) SARS-CoV-2 target nucleic acids are NOT DETECTED.  The SARS-CoV-2 RNA is generally detectable in upper and lower respiratory specimens during the acute phase of infection. The lowest concentration of SARS-CoV-2 viral copies this assay can detect is  250 copies / mL. A negative result does not preclude SARS-CoV-2 infection and should not be used as the sole basis for treatment or other patient management decisions.  A negative result may occur with improper specimen collection / handling, submission of specimen other than nasopharyngeal swab, presence of viral mutation(s) within the areas targeted by this assay, and inadequate number of viral copies (<250 copies / mL). A negative result must be combined with clinical observations, patient history, and epidemiological information.  Fact Sheet for Patients:   BoilerBrush.com.cy  Fact Sheet for Healthcare Providers: https://pope.com/  This test is not yet approved or  cleared by the [place] FDA and has been authorized for detection and/or diagnosis of SARS-CoV-2 by FDA under an Emergency Use Authorization (EUA).  This EUA will remain in effect (meaning this test can be used) for the duration of the COVID-19 declaration under Section 564(b)(1) of the Act, 21 U.S.C. section 360bbb-3(b)(1), unless the authorization is terminated or revoked sooner.  Performed at [facility], [address] [place], [place] [address]     Rexene Alberts, MSN, NP-C [facility]  [email or web address] Pager: [phone] Office: [phone] [facility] Main Line: [phone]

## 2020-03-17 NOTE — Plan of Care (Signed)
  Problem: Nutrition: Goal: Adequate nutrition will be maintained Outcome: Progressing   Problem: Pain Managment: Goal: General experience of comfort will improve Outcome: Progressing   Problem: Safety: Goal: Ability to remain free from injury will improve Outcome: Progressing   

## 2020-03-17 NOTE — Progress Notes (Signed)
PROGRESS NOTE    Deborah Marsh  UXN:[id number] DOB: [birth year] DOA: 03/15/2020 PCP: Suzan Slick, MD   Brief Narrative:  HPI On 03/16/2020 by Dr. Midge Minium Deborah Marsh is a 69 y.o. female with history of hypertension, COPD, prediabetes previous history of tobacco abuse quit 4 years ago recently treated for COPD exacerbation with steroids and antibiotics presents to the ER because of poor vision in the right eye.  Patient symptoms started on October 14 with mild blurriness of the vision with no eye pain or tearing.  Patient has history of left eye central vision loss since age 60.  Patient's right blurry vision gradually worsened and presented to the ER at [facility] on August 15th 2021 CT head was done which was unremarkable and was referred to ophthalmologist.  Patient went to the ophthalmologist on March 15, 2020 and was advised to come to the ER for further work-up as per the report from the ER physician ophthalmologist did not see any cause for the right eye vision loss.  By now patient has complete loss of vision on the right eye.  Denies any weakness of the upper or lower extremities.  Interim history Patient admitted with right eye painless vision loss.  Neurology consulted and appreciated.  Patient was placed on high-dose steroids along with IV Unasyn.  Assessment & Plan   Right eye painless vision loss possibly secondary to sphenoid sinusitis -Patient had seen an ophthalmologist and was advised to come to the ER -MRI brain/orbits showed dural thickening along planum sphenoidale on the right.  Edema in the right optic nerve extending through the optic canal with surrounding enhancement suggestive inflammatory or infectious process likely related to sinusitis. -Neurology consulted and appreciated, placed patient on high-dose steroids -ENT also consulted recommended IV Unasyn -ID consulted and appreciated  Essential hypertension -Continue losartan  with hydralazine as needed  COPD -Currently stable, no wheezing -Continue inhalers as needed  Prediabetes -Hemoglobin A1c 5.8  Possible UTI -UA shows few bacteria, 21-50 WBC, moderate leukocytes -Urine culture showed multiple species -Patient was given ceftriaxone  Constipation -Was given MiraLAX and Senokot-S -Continues to have constipation, order Dulcolax suppository  DVT Prophylaxis  SCDs  Code Status: Full  Family Communication: None at bedside  Disposition Plan:  Status is: Inpatient  Remains inpatient appropriate because:Inpatient level of care appropriate due to severity of illness- continues to have visual deficits    Dispo: The patient is from: Home              Anticipated d/c is to: Home              Anticipated d/c date is: > 3 days              Patient currently is not medically stable to d/c.   Consultants Neurology ENT  Procedures  None  Antibiotics   Anti-infectives (From admission, onward)   Start     Dose/Rate Route Frequency Ordered Stop   03/16/20 0930  Ampicillin-Sulbactam (UNASYN) 3 g in sodium chloride 0.9 % 100 mL IVPB     Discontinue     3 g 200 mL/hr over 30 Minutes Intravenous Every 6 hours 03/16/20 0923     03/16/20 0615  cefTRIAXone (ROCEPHIN) 1 g in sodium chloride 0.9 % 100 mL IVPB  Status:  Discontinued        1 g 200 mL/hr over 30 Minutes Intravenous Daily 03/16/20 0547 03/16/20 0923      Subjective:  Deborah Marsh seen and examined today.  Still cannot see anything out of her right eye. Cannot see light. Denies dizziness or headache. Denies chest pain, shortness of breath, abdominal pain. Complains of constipation.   Objective:   Vitals:   03/16/20 2329 03/17/20 0346 03/17/20 0836 03/17/20 1137  BP: 138/73 126/66 140/79 (!) 141/77  Pulse: 78 65 (!) 52 (!) 53  Resp: 16 18 18 18   Temp: 97.7 F (36.5 C) (!) 97.5 F (36.4 C) 98.4 F (36.9 C) 98 F (36.7 C)  TempSrc: Oral Oral Oral Oral  SpO2: 94% 95% 98% 100%     Intake/Output Summary (Last 24 hours) at 03/17/2020 1454 Last data filed at 03/17/2020 0900 Gross per 24 hour  Intake 2083 ml  Output --  Net 2083 ml   There were no vitals filed for this visit.  Exam  General: Well developed, well nourished, NAD, appears stated age  HEENT: NCAT, R pupil sluggish, mucous membranes moist.   Cardiovascular: S1 S2 auscultated, RRR, no murmur  Respiratory: Clear to auscultation bilaterally  Abdomen: Soft, nontender, nondistended, + bowel sounds  Extremities: warm dry without cyanosis clubbing or edema  Neuro: AAOx3, other than visual changes, nonfocal  Psych: Appropriate mood and affect  Data Reviewed: I have personally reviewed following labs and imaging studies  CBC: Recent Labs  Lab 03/14/20 1706 03/15/20 1325 03/17/20 0159  WBC 9.0 9.1 9.8  NEUTROABS 5.7 6.5 9.1*  HGB 14.3 14.9 14.3  HCT 42.5 45.3 40.7  MCV 87.6 86.9 85.3  PLT 267 272 278   Basic Metabolic Panel: Recent Labs  Lab 03/14/20 1706 03/15/20 1325 03/17/20 0159  NA 139 140 137  K 3.1* 3.6 3.9  CL 101 100 103  CO2 27 30 26   GLUCOSE 108* 95 164*  BUN 14 11 11   CREATININE 1.05* 0.92 0.92  CALCIUM 9.6 9.8 8.9  MG  --   --  2.0   GFR: Estimated Creatinine Clearance: 59.1 mL/min (by C-G formula based on SCr of 0.92 mg/dL). Liver Function Tests: Recent Labs  Lab 03/14/20 1706 03/15/20 1325  AST 17 14*  ALT 16 16  ALKPHOS 39 40  BILITOT 0.8 1.0  PROT 7.7 7.6  ALBUMIN 4.2 4.1   No results for input(s): LIPASE, AMYLASE in the last 168 hours. No results for input(s): AMMONIA in the last 168 hours. Coagulation Profile: Recent Labs  Lab 03/15/20 1325  INR 1.1   Cardiac Enzymes: No results for input(s): CKTOTAL, CKMB, CKMBINDEX, TROPONINI in the last 168 hours. BNP (last 3 results) No results for input(s): PROBNP in the last 8760 hours. HbA1C: Recent Labs    03/16/20 0539  HGBA1C 5.8*   CBG: No results for input(s): GLUCAP in the last 168  hours. Lipid Profile: Recent Labs    03/16/20 0539  CHOL 200  HDL 71  LDLCALC 111*  TRIG 89  CHOLHDL 2.8   Thyroid Function Tests: No results for input(s): TSH, T4TOTAL, FREET4, T3FREE, THYROIDAB in the last 72 hours. Anemia Panel: No results for input(s): VITAMINB12, FOLATE, FERRITIN, TIBC, IRON, RETICCTPCT in the last 72 hours. Urine analysis:    Component Value Date/Time   COLORURINE AMBER (A) 03/15/2020 2000   APPEARANCEUR TURBID (A) 03/15/2020 2000   LABSPEC 1.013 03/15/2020 2000   PHURINE 6.0 03/15/2020 2000   GLUCOSEU NEGATIVE 03/15/2020 2000   HGBUR NEGATIVE 03/15/2020 2000   BILIRUBINUR NEGATIVE 03/15/2020 2000   KETONESUR 20 (A) 03/15/2020 2000   PROTEINUR NEGATIVE 03/15/2020 2000   NITRITE  NEGATIVE 03/15/2020 2000   LEUKOCYTESUR MODERATE (A) 03/15/2020 2000   Sepsis Labs: (procalcitonin:4,lacticidven:4)  ) Recent Results (from the past 240 hour(s))  Culture, Urine     Status: Abnormal   Collection Time: 03/15/20  8:00 PM   Specimen: Urine, Random  Result Value Ref Range Status   Specimen Description URINE, RANDOM  Final   Special Requests   Final    NONE Performed at [facility], [address] [place], [place] [address]    Culture MULTIPLE SPECIES PRESENT, SUGGEST RECOLLECTION (A)  Final   Report Status 03/17/2020 FINAL  Final  SARS Coronavirus 2 by RT PCR (hospital order, performed in [facility] hospital lab) Nasopharyngeal Nasopharyngeal Swab     Status: None   Collection Time: 03/16/20  3:39 AM   Specimen: Nasopharyngeal Swab  Result Value Ref Range Status   SARS Coronavirus 2 NEGATIVE NEGATIVE Final    Comment: (NOTE) SARS-CoV-2 target nucleic acids are NOT DETECTED.  The SARS-CoV-2 RNA is generally detectable in upper and lower respiratory specimens during the acute phase of infection. The lowest concentration of SARS-CoV-2 viral copies this assay can detect is 250 copies / mL. A negative result does not preclude SARS-CoV-2  infection and should not be used as the sole basis for treatment or other patient management decisions.  A negative result may occur with improper specimen collection / handling, submission of specimen other than nasopharyngeal swab, presence of viral mutation(s) within the areas targeted by this assay, and inadequate number of viral copies (<250 copies / mL). A negative result must be combined with clinical observations, patient history, and epidemiological information.  Fact Sheet for Patients:   BoilerBrush.com.cy  Fact Sheet for Healthcare Providers: https://pope.com/  This test is not yet approved or  cleared by the [place] FDA and has been authorized for detection and/or diagnosis of SARS-CoV-2 by FDA under an Emergency Use Authorization (EUA).  This EUA will remain in effect (meaning this test can be used) for the duration of the COVID-19 declaration under Section 564(b)(1) of the Act, 21 U.S.C. section 360bbb-3(b)(1), unless the authorization is terminated or revoked sooner.  Performed at [facility], [address], [place], [place] [address]       Radiology Studies: MR ANGIO HEAD WO CONTRAST  Result Date: 03/15/2020 CLINICAL DATA:  Worsening right eye vision loss. Concern for stroke. EXAM: MR HEAD WITHOUT CONTRAST MR CIRCLE OF WILLIS WITHOUT CONTRAST MRA OF THE NECK WITHOUT AND WITH CONTRAST TECHNIQUE: Multiplanar, multiecho pulse sequences of the brain, circle of willis and surrounding structures were obtained without intravenous contrast. Angiographic images of the neck were obtained using MRA technique without and with intravenous contrast. CONTRAST:  8mL GADAVIST GADOBUTROL 1 MMOL/ML IV SOLN COMPARISON:  Head CT 03/14/2020 FINDINGS: MR HEAD FINDINGS Brain: There is no evidence of an acute infarct, intracranial hemorrhage, mass, midline shift, or extra-axial fluid collection. The ventricles and sulci are normal.  Small foci of T2 hyperintensity in the cerebral white matter bilaterally are nonspecific but compatible with mild chronic small vessel ischemic disease. No abnormal enhancement is identified. Vascular: Major intracranial vascular flow voids are preserved. Skull and upper cervical spine: Unremarkable bone marrow signal. Sinuses/Orbits: Unremarkable orbits. Widespread mucosal thickening in the paranasal sinuses with complete opacification of the right frontal sinus and right sphenoid sinus and subtotal opacification of the left sphenoid sinus. Bilateral maxillary sinus fluid. Clear mastoid air cells. Other: None. MR CIRCLE OF WILLIS FINDINGS The intracranial vertebral arteries are widely  patent to the basilar. The left PICA and right AICA appear dominant. Widely patent SCA origins are seen bilaterally. The basilar artery is widely patent. There are small posterior communicating arteries bilaterally. Both PCAs are patent without evidence of a significant proximal stenosis. The internal carotid arteries are patent from skull base to carotid termini without evidence of a significant stenosis. Artifact is noted in the proximal petrous ICA segments bilaterally. ACAs and MCAs are patent without evidence of a proximal branch occlusion or significant proximal stenosis. No aneurysm is identified. MRA NECK FINDINGS The study is mildly motion degraded. There is a standard 3 vessel aortic arch. The brachiocephalic and subclavian arteries are widely patent. The common carotid and cervical internal carotid arteries are patent bilaterally without evidence of significant stenosis or dissection. There is mild beading of the mid to distal right cervical ICA. The vertebral arteries are patent and codominant with antegrade flow bilaterally. No significant vertebral artery stenosis or dissection is identified. IMPRESSION: 1. No acute intracranial abnormality. 2. Mild chronic small vessel ischemic disease. 3. Pansinusitis. 4. Negative head  MRA. 5. Widely patent cervical carotid and vertebral arteries. 6. Possible mild changes of fibromuscular dysplasia in the right cervical ICA. Electronically Signed   By: Sebastian Ache M.D.   On: 03/15/2020 16:45   MR Angiogram Neck W or Wo Contrast  Result Date: 03/15/2020 CLINICAL DATA:  Worsening right eye vision loss. Concern for stroke. EXAM: MR HEAD WITHOUT CONTRAST MR CIRCLE OF WILLIS WITHOUT CONTRAST MRA OF THE NECK WITHOUT AND WITH CONTRAST TECHNIQUE: Multiplanar, multiecho pulse sequences of the brain, circle of willis and surrounding structures were obtained without intravenous contrast. Angiographic images of the neck were obtained using MRA technique without and with intravenous contrast. CONTRAST:  17mL GADAVIST GADOBUTROL 1 MMOL/ML IV SOLN COMPARISON:  Head CT 03/14/2020 FINDINGS: MR HEAD FINDINGS Brain: There is no evidence of an acute infarct, intracranial hemorrhage, mass, midline shift, or extra-axial fluid collection. The ventricles and sulci are normal. Small foci of T2 hyperintensity in the cerebral white matter bilaterally are nonspecific but compatible with mild chronic small vessel ischemic disease. No abnormal enhancement is identified. Vascular: Major intracranial vascular flow voids are preserved. Skull and upper cervical spine: Unremarkable bone marrow signal. Sinuses/Orbits: Unremarkable orbits. Widespread mucosal thickening in the paranasal sinuses with complete opacification of the right frontal sinus and right sphenoid sinus and subtotal opacification of the left sphenoid sinus. Bilateral maxillary sinus fluid. Clear mastoid air cells. Other: None. MR CIRCLE OF WILLIS FINDINGS The intracranial vertebral arteries are widely patent to the basilar. The left PICA and right AICA appear dominant. Widely patent SCA origins are seen bilaterally. The basilar artery is widely patent. There are small posterior communicating arteries bilaterally. Both PCAs are patent without evidence of a  significant proximal stenosis. The internal carotid arteries are patent from skull base to carotid termini without evidence of a significant stenosis. Artifact is noted in the proximal petrous ICA segments bilaterally. ACAs and MCAs are patent without evidence of a proximal branch occlusion or significant proximal stenosis. No aneurysm is identified. MRA NECK FINDINGS The study is mildly motion degraded. There is a standard 3 vessel aortic arch. The brachiocephalic and subclavian arteries are widely patent. The common carotid and cervical internal carotid arteries are patent bilaterally without evidence of significant stenosis or dissection. There is mild beading of the mid to distal right cervical ICA. The vertebral arteries are patent and codominant with antegrade flow bilaterally. No significant vertebral artery stenosis or dissection is  identified. IMPRESSION: 1. No acute intracranial abnormality. 2. Mild chronic small vessel ischemic disease. 3. Pansinusitis. 4. Negative head MRA. 5. Widely patent cervical carotid and vertebral arteries. 6. Possible mild changes of fibromuscular dysplasia in the right cervical ICA. Electronically Signed   By: Sebastian Ache M.D.   On: 03/15/2020 16:45   MR BRAIN WO CONTRAST  Result Date: 03/15/2020 CLINICAL DATA:  Worsening right eye vision loss. Concern for stroke. EXAM: MR HEAD WITHOUT CONTRAST MR CIRCLE OF WILLIS WITHOUT CONTRAST MRA OF THE NECK WITHOUT AND WITH CONTRAST TECHNIQUE: Multiplanar, multiecho pulse sequences of the brain, circle of willis and surrounding structures were obtained without intravenous contrast. Angiographic images of the neck were obtained using MRA technique without and with intravenous contrast. CONTRAST:  8mL GADAVIST GADOBUTROL 1 MMOL/ML IV SOLN COMPARISON:  Head CT 03/14/2020 FINDINGS: MR HEAD FINDINGS Brain: There is no evidence of an acute infarct, intracranial hemorrhage, mass, midline shift, or extra-axial fluid collection. The ventricles  and sulci are normal. Small foci of T2 hyperintensity in the cerebral white matter bilaterally are nonspecific but compatible with mild chronic small vessel ischemic disease. No abnormal enhancement is identified. Vascular: Major intracranial vascular flow voids are preserved. Skull and upper cervical spine: Unremarkable bone marrow signal. Sinuses/Orbits: Unremarkable orbits. Widespread mucosal thickening in the paranasal sinuses with complete opacification of the right frontal sinus and right sphenoid sinus and subtotal opacification of the left sphenoid sinus. Bilateral maxillary sinus fluid. Clear mastoid air cells. Other: None. MR CIRCLE OF WILLIS FINDINGS The intracranial vertebral arteries are widely patent to the basilar. The left PICA and right AICA appear dominant. Widely patent SCA origins are seen bilaterally. The basilar artery is widely patent. There are small posterior communicating arteries bilaterally. Both PCAs are patent without evidence of a significant proximal stenosis. The internal carotid arteries are patent from skull base to carotid termini without evidence of a significant stenosis. Artifact is noted in the proximal petrous ICA segments bilaterally. ACAs and MCAs are patent without evidence of a proximal branch occlusion or significant proximal stenosis. No aneurysm is identified. MRA NECK FINDINGS The study is mildly motion degraded. There is a standard 3 vessel aortic arch. The brachiocephalic and subclavian arteries are widely patent. The common carotid and cervical internal carotid arteries are patent bilaterally without evidence of significant stenosis or dissection. There is mild beading of the mid to distal right cervical ICA. The vertebral arteries are patent and codominant with antegrade flow bilaterally. No significant vertebral artery stenosis or dissection is identified. IMPRESSION: 1. No acute intracranial abnormality. 2. Mild chronic small vessel ischemic disease. 3.  Pansinusitis. 4. Negative head MRA. 5. Widely patent cervical carotid and vertebral arteries. 6. Possible mild changes of fibromuscular dysplasia in the right cervical ICA. Electronically Signed   By: Sebastian Ache M.D.   On: 03/15/2020 16:45   MR BRAIN W CONTRAST  Addendum Date: 03/16/2020   ADDENDUM REPORT: 03/16/2020 09:01 ADDENDUM: These results were called by telephone at the time of interpretation on 03/16/2020 at 9:00 am to provider Hanley Ben, who verbally acknowledged these results. Electronically Signed   By: Marlan Palau M.D.   On: 03/16/2020 09:01   Result Date: 03/16/2020 CLINICAL DATA:  Right eye vision loss EXAM: MRI HEAD WITH CONTRAST MRI ORBITS WITHOUT AND WITH CONTRAST TECHNIQUE: Multiplanar, multiecho pulse sequences of the brain and surrounding structures were obtained with intravenous contrast. Multiplanar, multiecho pulse sequences of the orbits and surrounding structures were obtained including fat saturation techniques, before and after intravenous  contrast administration. CONTRAST:  8mL GADAVIST GADOBUTROL 1 MMOL/ML IV SOLN COMPARISON:  MRI head without with contrast 03/15/2020 FINDINGS: MRI HEAD FINDINGS There is mild dural thickening and enhancement along the planum sphenoidale on the right. This is unchanged from yesterday. Remainder of the enhancement of the brain and surrounding structures is normal. There is extensive associated sinus mucosal disease throughout the paranasal sinuses including the sphenoid sinus bilaterally. See additional discussion below. Pituitary enhances normally. Infundibulum midline. No enhancing skeletal lesion. MRI ORBITS FINDINGS Orbits: Globe is symmetric and normal bilaterally. No orbital mass or edema. There is dural thickening and enhancement along the planum sphenoidale on the right. See axial postcontrast image 10 and coronal postcontrast image 9. This is in the region of the optic canal. There appears to be some swelling of the optic nerve within the  optic canal with surrounding enhancement, series 18, image 20. There is extensive mucosal edema in the adjacent sphenoid sinus and this may represent inflammatory or infectious process. Remainder of the optic nerves are normal. Pituitary normal.  Cavernous sinus normal. Visualized sinuses: Extensive mucosal edema throughout the paranasal sinuses. Air-fluid levels in the maxillary sinus bilaterally. Soft tissues: No soft tissue mass or edema. IMPRESSION: Dural thickening along the planum sphenoidale on the right. There appears to be edema in the right optic nerve extending through the optic canal with surrounding enhancement suggestive of inflammatory or infectious process likely related to sinusitis. Tumor not likely. No enhancement of the adjacent brain. No brain abscess. Electronically Signed: By: Marlan Palauharles  Clark M.D. On: 03/16/2020 08:57   MR ORBITS W WO CONTRAST  Addendum Date: 03/16/2020   ADDENDUM REPORT: 03/16/2020 09:01 ADDENDUM: These results were called by telephone at the time of interpretation on 03/16/2020 at 9:00 am to provider Hanley BenAlekh, who verbally acknowledged these results. Electronically Signed   By: Marlan Palauharles  Clark M.D.   On: 03/16/2020 09:01   Result Date: 03/16/2020 CLINICAL DATA:  Right eye vision loss EXAM: MRI HEAD WITH CONTRAST MRI ORBITS WITHOUT AND WITH CONTRAST TECHNIQUE: Multiplanar, multiecho pulse sequences of the brain and surrounding structures were obtained with intravenous contrast. Multiplanar, multiecho pulse sequences of the orbits and surrounding structures were obtained including fat saturation techniques, before and after intravenous contrast administration. CONTRAST:  8mL GADAVIST GADOBUTROL 1 MMOL/ML IV SOLN COMPARISON:  MRI head without with contrast 03/15/2020 FINDINGS: MRI HEAD FINDINGS There is mild dural thickening and enhancement along the planum sphenoidale on the right. This is unchanged from yesterday. Remainder of the enhancement of the brain and surrounding  structures is normal. There is extensive associated sinus mucosal disease throughout the paranasal sinuses including the sphenoid sinus bilaterally. See additional discussion below. Pituitary enhances normally. Infundibulum midline. No enhancing skeletal lesion. MRI ORBITS FINDINGS Orbits: Globe is symmetric and normal bilaterally. No orbital mass or edema. There is dural thickening and enhancement along the planum sphenoidale on the right. See axial postcontrast image 10 and coronal postcontrast image 9. This is in the region of the optic canal. There appears to be some swelling of the optic nerve within the optic canal with surrounding enhancement, series 18, image 20. There is extensive mucosal edema in the adjacent sphenoid sinus and this may represent inflammatory or infectious process. Remainder of the optic nerves are normal. Pituitary normal.  Cavernous sinus normal. Visualized sinuses: Extensive mucosal edema throughout the paranasal sinuses. Air-fluid levels in the maxillary sinus bilaterally. Soft tissues: No soft tissue mass or edema. IMPRESSION: Dural thickening along the planum sphenoidale on the right. There  appears to be edema in the right optic nerve extending through the optic canal with surrounding enhancement suggestive of inflammatory or infectious process likely related to sinusitis. Tumor not likely. No enhancement of the adjacent brain. No brain abscess. Electronically Signed: By: Marlan Palau M.D. On: 03/16/2020 08:57     Scheduled Meds: . aspirin  81 mg Oral Daily  . bisacodyl  10 mg Rectal Once  . fluticasone  1 spray Each Nare Daily  . losartan  25 mg Oral Daily  . senna-docusate  1 tablet Oral BID   Continuous Infusions: . ampicillin-sulbactam (UNASYN) IV 3 g (03/17/20 1214)  . methylPREDNISolone (SOLU-MEDROL) injection 1,000 mg (03/17/20 1020)     LOS: 1 day   Time Spent in minutes   45 minutes  Umer Harig D.O. on 03/17/2020 at 2:54 PM  Between 7am to 7pm -  Please see pager noted on [email or web address]  After 7pm go to [email or web address]  And look for the night coverage person covering for me after hours  [facility] Office  [phone]

## 2020-03-18 LAB — MISC LABCORP TEST (SEND OUT): Labcorp test code: 142455

## 2020-03-18 LAB — RPR: RPR Ser Ql: NONREACTIVE — AB

## 2020-03-18 LAB — BRUCELLA ANTIBODY IGG, EIA: Brucella Antibody IgG, EIA: NEGATIVE

## 2020-03-18 MED ORDER — PREDNISONE 20 MG PO TABS: ORAL_TABLET | ORAL | 0 refills | Status: AC

## 2020-03-18 MED ORDER — ASPIRIN 81 MG PO CHEW: 81.0000 mg | CHEWABLE_TABLET | Freq: Every day | ORAL | Status: AC

## 2020-03-18 MED ORDER — AMOXICILLIN-POT CLAVULANATE 875-125 MG PO TABS: 1.0000 | ORAL_TABLET | Freq: Two times a day (BID) | ORAL | 0 refills | Status: AC

## 2020-03-18 MED ORDER — AMOXICILLIN-POT CLAVULANATE 875-125 MG PO TABS
1.0000 | ORAL_TABLET | Freq: Two times a day (BID) | ORAL | Status: DC
  Administered 2020-03-18: 1 via ORAL
  Filled 2020-03-18: qty 1

## 2020-03-18 MED ORDER — PREDNISONE 50 MG PO TABS: 100.0000 mg | ORAL_TABLET | Freq: Every day | ORAL | Status: DC

## 2020-03-18 NOTE — Plan of Care (Signed)

## 2020-03-18 NOTE — TOC Transition Note (Signed)
Transition of Care [facility]) - CM/SW Discharge Note   Patient Details  Name: Deborah Marsh MRN: [id number] Date of Birth: October 18, 1950  [facility] Care [facility] CM/SW Contact:  Baldemar Lenis, LCSW Phone Number: 03/18/2020, 3:00 PM   Clinical Narrative:   Patient discharging home, CSW alerted [facility]. Patient would like 3N1, sent referral to [employer]. Patient will have transport, granddaughter picking her up after work. No other needs at this time.    Final next level of care: Home w Home Health Services Barriers to Discharge: Barriers Resolved   Patient Goals and CMS Choice Patient states their goals for this hospitalization and ongoing recovery are:: to be able to see again CMS Medicare.gov Compare Post Acute Care list provided to:: Patient Choice offered to / list presented to : Patient  Discharge Placement                Patient to be transferred to facility by: Private vehicle Name of family member notified: Self Patient and family notified of of transfer: 03/18/20  Discharge Plan and Services   Discharge Planning Services: CM Consult Post Acute Care Choice: Home Health          DME Arranged: 3-N-1 DME Agency: [employer] Date DME Agency Contacted: 03/18/20   Representative spoke with at DME Agency: Velna Hatchet HH Arranged: PT, OT [facility] Agency: [facility] Date [facility] Agency Contacted: 03/17/20   Representative spoke with at [facility] Agency: Kandee Keen  Social Determinants of Health (SDOH) Interventions     Readmission Risk Interventions No flowsheet data found.

## 2020-03-18 NOTE — Discharge Summary (Signed)
Physician Discharge Summary  Deborah Marsh Musso OEU:[id number] DOB: August 24, 1950 DOA: 03/15/2020  PCP: Suzan Slick, MD  Admit date: 03/15/2020 Discharge date: 03/18/2020  Time spent: 45 minutes  Recommendations for Outpatient Follow-up:  Patient will be discharged to home with home health services.  Patient will need to follow up with primary care provider within one week of discharge.  Follow up with ENT. Patient should continue medications as prescribed.  Patient should follow a heart healthy diet.   Discharge Diagnoses:  Right eye painless vision loss possibly secondary to sphenoid sinusitis Essential hypertension COPD Prediabetes Possible UTI/Asymptomatic bacteriuria Constipation  Discharge Condition: stable  Diet recommendation: heart healthy  There were no vitals filed for this visit.  History of present illness:  On 03/16/2020 by Dr. Midge Minium Trecia Rogers Gantis a 69 y.o.femalewithhistory of hypertension, COPD, prediabetes previous history of tobacco abuse quit 4 years ago recently treated for COPD exacerbation with steroids and antibiotics presents to the ER because of poor vision in the right eye. Patient symptoms started on October 14 with mild blurriness of the vision with no eye pain or tearing. Patient has history of left eye central vision loss since age 48. Patient's right blurry vision gradually worsened and presented to the ER at [facility] on August 15th 2021 CT head was done which was unremarkable and was referred to ophthalmologist. Patient went to the ophthalmologist on March 15, 2020 and was advised to come to the ER for further work-up as per the report from the ER physician ophthalmologist did not see any cause for the right eye vision loss. By now patient has complete loss of vision on the right eye. Denies any weakness of the upper or lower extremities.  Hospital Course:  Right eye painless vision loss possibly secondary to  sphenoid sinusitis -Patient had seen an ophthalmologist and was advised to come to the ER -MRI brain/orbits showed dural thickening along planum sphenoidale on the right.  Edema in the right optic nerve extending through the optic canal with surrounding enhancement suggestive inflammatory or infectious process likely related to sinusitis. -Neurology consulted and appreciated, placed patient on high-dose steroids. Given that this is not optic neuritis, transitioned patient to prednisone 100mg  and recommended taper  -ENT also consulted recommended IV Unasyn and follow up in the office in a few weeks- as patient did not want surgery -ID consulted and appreciated- transitioned patient to Augmentin today  Essential hypertension -Continue losartan   COPD -Currently stable, no wheezing -Continue inhalers as needed  Prediabetes -Hemoglobin A1c 5.8  Possible UTI/Asymptomatic bacteriuria  -UA shows few bacteria, 21-50 WBC, moderate leukocytes -Urine culture showed multiple species -Patient was given ceftriaxone  Constipation -Was given MiraLAX and Senokot-S, dulcolax suppository  Consultants Neurology ENT  Procedures  None  Discharge Exam: Vitals:   03/18/20 0820 03/18/20 1138  BP: (!) 142/75 (!) 157/79  Pulse: 73 (!) 49  Resp: 14 18  Temp: 97.7 F (36.5 C) 97.7 F (36.5 C)  SpO2: 99% 98%     General: Well developed, well nourished, NAD, appears stated age  HEENT: NCAT, R pupil not reactive. Mucous membranes moist.  Cardiovascular: S1 S2 auscultated, RRR  Respiratory: Clear to auscultation bilaterally with equal chest rise  Abdomen: Soft, obese, nontender, nondistended, + bowel sounds  Extremities: warm dry without cyanosis clubbing or edema  Neuro: AAOx3, right eye blindness, left eye visual deficits, no new changes  Psych: Appropriate mood and affect  Discharge Instructions Discharge Instructions    Discharge  instructions   Complete by: As directed     Patient will be discharged to home with home health services.  Patient will need to follow up with primary care provider within one week of discharge.  Follow up with ENT. Patient should continue medications as prescribed.  Patient should follow a heart healthy diet.     Allergies as of 03/18/2020   No Known Allergies     Medication List    TAKE these medications   Ventolin HFA 108 (90 Base) MCG/ACT inhaler Generic drug: albuterol Inhale 2 puffs into the lungs every 6 (six) hours as needed for wheezing or shortness of breath.   albuterol (2.5 MG/3ML) 0.083% nebulizer solution Commonly known as: PROVENTIL Take 2.5 mg by nebulization every 6 (six) hours as needed for wheezing or shortness of breath.   ALPRAZolam 0.5 MG tablet Commonly known as: XANAX Take 0.5 mg by mouth 2 (two) times daily as needed for anxiety.   amoxicillin-clavulanate 875-125 MG tablet Commonly known as: AUGMENTIN Take 1 tablet by mouth every 12 (twelve) hours for 12 days. Please provide sufficient quantity for therapy through 03/30/20   APPLE CIDER VINEGAR PO Take 1 tablet by mouth daily.   aspirin 81 MG chewable tablet Chew 1 tablet (81 mg total) by mouth daily. Start taking on: March 19, 2020   Cinnamon 500 MG capsule Take 500 mg by mouth daily.   fluticasone 50 MCG/ACT nasal spray Commonly known as: FLONASE Place 1 spray into both nostrils daily.   GARLIC PO Take 1 tablet by mouth daily.   ketoconazole 2 % cream Commonly known as: NIZORAL Apply 1 application topically 2 (two) times daily as needed for irritation.   losartan 25 MG tablet Commonly known as: COZAAR Take 25 mg by mouth daily.   predniSONE 20 MG tablet Commonly known as: DELTASONE Take 3 tablets (60 mg total) by mouth daily with breakfast for 1 day, THEN 2 tablets (40 mg total) daily with breakfast for 1 day, THEN 1 tablet (20 mg total) daily with breakfast for 1 day. Start taking on: March 18, 2020   traMADol 50 MG  tablet Commonly known as: ULTRAM Take 50 mg by mouth 2 (two) times daily as needed for moderate pain.   triamterene-hydrochlorothiazide 37.5-25 MG tablet Commonly known as: MAXZIDE-25 Take 1 tablet by mouth daily.   VITAMIN D-3 PO Take 1 capsule by mouth daily.      No Known Allergies  Follow-up Information    Suzan Slick, MD. Schedule an appointment as soon as possible for a visit in 1 week(s).   Specialty: Family Medicine Why: Hospital follow up Contact information: [address] [place] [address] [phone]        Suzanna Obey, MD. Schedule an appointment as soon as possible for a visit in 2 week(s).   Specialty: Otolaryngology Why: Hospital follow up Contact information: [address] [place] [address] [phone]                The results of significant diagnostics from this hospitalization (including imaging, microbiology, ancillary and laboratory) are listed below for reference.    Significant Diagnostic Studies: CT Head Wo Contrast  Result Date: 03/14/2020 CLINICAL DATA:  Monocular vision loss EXAM: CT HEAD WITHOUT CONTRAST TECHNIQUE: Contiguous axial images were obtained from the base of the skull through the vertex without intravenous contrast. COMPARISON:  None. FINDINGS: Brain: No evidence of acute territorial infarction, hemorrhage, hydrocephalus,extra-axial collection or mass lesion/mass effect. Normal gray-white  differentiation. Ventricles are normal in size and contour. Vascular: No hyperdense vessel or unexpected calcification. Skull: The skull is intact. No fracture or focal lesion identified. Sinuses/Orbits: Fluid and mucosal thickening seen within the bilateral maxillary sinuses and ethmoid air cells. There is also complete opacification of the sphenoid air cells and right frontal sinus. The orbits and globes intact. Other: None IMPRESSION: No acute intracranial abnormality. Findings which could be suggestive of pansinusitis.  Electronically Signed   By: Jonna Clark M.D.   On: 03/14/2020 18:18   MR ANGIO HEAD WO CONTRAST  Result Date: 03/15/2020 CLINICAL DATA:  Worsening right eye vision loss. Concern for stroke. EXAM: MR HEAD WITHOUT CONTRAST MR CIRCLE OF WILLIS WITHOUT CONTRAST MRA OF THE NECK WITHOUT AND WITH CONTRAST TECHNIQUE: Multiplanar, multiecho pulse sequences of the brain, circle of willis and surrounding structures were obtained without intravenous contrast. Angiographic images of the neck were obtained using MRA technique without and with intravenous contrast. CONTRAST:  8mL GADAVIST GADOBUTROL 1 MMOL/ML IV SOLN COMPARISON:  Head CT 03/14/2020 FINDINGS: MR HEAD FINDINGS Brain: There is no evidence of an acute infarct, intracranial hemorrhage, mass, midline shift, or extra-axial fluid collection. The ventricles and sulci are normal. Small foci of T2 hyperintensity in the cerebral white matter bilaterally are nonspecific but compatible with mild chronic small vessel ischemic disease. No abnormal enhancement is identified. Vascular: Major intracranial vascular flow voids are preserved. Skull and upper cervical spine: Unremarkable bone marrow signal. Sinuses/Orbits: Unremarkable orbits. Widespread mucosal thickening in the paranasal sinuses with complete opacification of the right frontal sinus and right sphenoid sinus and subtotal opacification of the left sphenoid sinus. Bilateral maxillary sinus fluid. Clear mastoid air cells. Other: None. MR CIRCLE OF WILLIS FINDINGS The intracranial vertebral arteries are widely patent to the basilar. The left PICA and right AICA appear dominant. Widely patent SCA origins are seen bilaterally. The basilar artery is widely patent. There are small posterior communicating arteries bilaterally. Both PCAs are patent without evidence of a significant proximal stenosis. The internal carotid arteries are patent from skull base to carotid termini without evidence of a significant stenosis.  Artifact is noted in the proximal petrous ICA segments bilaterally. ACAs and MCAs are patent without evidence of a proximal branch occlusion or significant proximal stenosis. No aneurysm is identified. MRA NECK FINDINGS The study is mildly motion degraded. There is a standard 3 vessel aortic arch. The brachiocephalic and subclavian arteries are widely patent. The common carotid and cervical internal carotid arteries are patent bilaterally without evidence of significant stenosis or dissection. There is mild beading of the mid to distal right cervical ICA. The vertebral arteries are patent and codominant with antegrade flow bilaterally. No significant vertebral artery stenosis or dissection is identified. IMPRESSION: 1. No acute intracranial abnormality. 2. Mild chronic small vessel ischemic disease. 3. Pansinusitis. 4. Negative head MRA. 5. Widely patent cervical carotid and vertebral arteries. 6. Possible mild changes of fibromuscular dysplasia in the right cervical ICA. Electronically Signed   By: Sebastian Ache M.D.   On: 03/15/2020 16:45   MR Angiogram Neck W or Wo Contrast  Result Date: 03/15/2020 CLINICAL DATA:  Worsening right eye vision loss. Concern for stroke. EXAM: MR HEAD WITHOUT CONTRAST MR CIRCLE OF WILLIS WITHOUT CONTRAST MRA OF THE NECK WITHOUT AND WITH CONTRAST TECHNIQUE: Multiplanar, multiecho pulse sequences of the brain, circle of willis and surrounding structures were obtained without intravenous contrast. Angiographic images of the neck were obtained using MRA technique without and with intravenous contrast. CONTRAST:  8mL GADAVIST GADOBUTROL 1 MMOL/ML IV SOLN COMPARISON:  Head CT 03/14/2020 FINDINGS: MR HEAD FINDINGS Brain: There is no evidence of an acute infarct, intracranial hemorrhage, mass, midline shift, or extra-axial fluid collection. The ventricles and sulci are normal. Small foci of T2 hyperintensity in the cerebral white matter bilaterally are nonspecific but compatible with mild  chronic small vessel ischemic disease. No abnormal enhancement is identified. Vascular: Major intracranial vascular flow voids are preserved. Skull and upper cervical spine: Unremarkable bone marrow signal. Sinuses/Orbits: Unremarkable orbits. Widespread mucosal thickening in the paranasal sinuses with complete opacification of the right frontal sinus and right sphenoid sinus and subtotal opacification of the left sphenoid sinus. Bilateral maxillary sinus fluid. Clear mastoid air cells. Other: None. MR CIRCLE OF WILLIS FINDINGS The intracranial vertebral arteries are widely patent to the basilar. The left PICA and right AICA appear dominant. Widely patent SCA origins are seen bilaterally. The basilar artery is widely patent. There are small posterior communicating arteries bilaterally. Both PCAs are patent without evidence of a significant proximal stenosis. The internal carotid arteries are patent from skull base to carotid termini without evidence of a significant stenosis. Artifact is noted in the proximal petrous ICA segments bilaterally. ACAs and MCAs are patent without evidence of a proximal branch occlusion or significant proximal stenosis. No aneurysm is identified. MRA NECK FINDINGS The study is mildly motion degraded. There is a standard 3 vessel aortic arch. The brachiocephalic and subclavian arteries are widely patent. The common carotid and cervical internal carotid arteries are patent bilaterally without evidence of significant stenosis or dissection. There is mild beading of the mid to distal right cervical ICA. The vertebral arteries are patent and codominant with antegrade flow bilaterally. No significant vertebral artery stenosis or dissection is identified. IMPRESSION: 1. No acute intracranial abnormality. 2. Mild chronic small vessel ischemic disease. 3. Pansinusitis. 4. Negative head MRA. 5. Widely patent cervical carotid and vertebral arteries. 6. Possible mild changes of fibromuscular  dysplasia in the right cervical ICA. Electronically Signed   By: Sebastian AcheAllen  Grady M.D.   On: 03/15/2020 16:45   MR BRAIN WO CONTRAST  Result Date: 03/15/2020 CLINICAL DATA:  Worsening right eye vision loss. Concern for stroke. EXAM: MR HEAD WITHOUT CONTRAST MR CIRCLE OF WILLIS WITHOUT CONTRAST MRA OF THE NECK WITHOUT AND WITH CONTRAST TECHNIQUE: Multiplanar, multiecho pulse sequences of the brain, circle of willis and surrounding structures were obtained without intravenous contrast. Angiographic images of the neck were obtained using MRA technique without and with intravenous contrast. CONTRAST:  8mL GADAVIST GADOBUTROL 1 MMOL/ML IV SOLN COMPARISON:  Head CT 03/14/2020 FINDINGS: MR HEAD FINDINGS Brain: There is no evidence of an acute infarct, intracranial hemorrhage, mass, midline shift, or extra-axial fluid collection. The ventricles and sulci are normal. Small foci of T2 hyperintensity in the cerebral white matter bilaterally are nonspecific but compatible with mild chronic small vessel ischemic disease. No abnormal enhancement is identified. Vascular: Major intracranial vascular flow voids are preserved. Skull and upper cervical spine: Unremarkable bone marrow signal. Sinuses/Orbits: Unremarkable orbits. Widespread mucosal thickening in the paranasal sinuses with complete opacification of the right frontal sinus and right sphenoid sinus and subtotal opacification of the left sphenoid sinus. Bilateral maxillary sinus fluid. Clear mastoid air cells. Other: None. MR CIRCLE OF WILLIS FINDINGS The intracranial vertebral arteries are widely patent to the basilar. The left PICA and right AICA appear dominant. Widely patent SCA origins are seen bilaterally. The basilar artery is widely patent. There are small posterior communicating arteries bilaterally. Both  PCAs are patent without evidence of a significant proximal stenosis. The internal carotid arteries are patent from skull base to carotid termini without evidence  of a significant stenosis. Artifact is noted in the proximal petrous ICA segments bilaterally. ACAs and MCAs are patent without evidence of a proximal branch occlusion or significant proximal stenosis. No aneurysm is identified. MRA NECK FINDINGS The study is mildly motion degraded. There is a standard 3 vessel aortic arch. The brachiocephalic and subclavian arteries are widely patent. The common carotid and cervical internal carotid arteries are patent bilaterally without evidence of significant stenosis or dissection. There is mild beading of the mid to distal right cervical ICA. The vertebral arteries are patent and codominant with antegrade flow bilaterally. No significant vertebral artery stenosis or dissection is identified. IMPRESSION: 1. No acute intracranial abnormality. 2. Mild chronic small vessel ischemic disease. 3. Pansinusitis. 4. Negative head MRA. 5. Widely patent cervical carotid and vertebral arteries. 6. Possible mild changes of fibromuscular dysplasia in the right cervical ICA. Electronically Signed   By: Sebastian Ache M.D.   On: 03/15/2020 16:45   MR BRAIN W CONTRAST  Addendum Date: 03/16/2020   ADDENDUM REPORT: 03/16/2020 09:01 ADDENDUM: These results were called by telephone at the time of interpretation on 03/16/2020 at 9:00 am to provider Hanley Ben, who verbally acknowledged these results. Electronically Signed   By: Marlan Palau M.D.   On: 03/16/2020 09:01   Result Date: 03/16/2020 CLINICAL DATA:  Right eye vision loss EXAM: MRI HEAD WITH CONTRAST MRI ORBITS WITHOUT AND WITH CONTRAST TECHNIQUE: Multiplanar, multiecho pulse sequences of the brain and surrounding structures were obtained with intravenous contrast. Multiplanar, multiecho pulse sequences of the orbits and surrounding structures were obtained including fat saturation techniques, before and after intravenous contrast administration. CONTRAST:  8mL GADAVIST GADOBUTROL 1 MMOL/ML IV SOLN COMPARISON:  MRI head without with  contrast 03/15/2020 FINDINGS: MRI HEAD FINDINGS There is mild dural thickening and enhancement along the planum sphenoidale on the right. This is unchanged from yesterday. Remainder of the enhancement of the brain and surrounding structures is normal. There is extensive associated sinus mucosal disease throughout the paranasal sinuses including the sphenoid sinus bilaterally. See additional discussion below. Pituitary enhances normally. Infundibulum midline. No enhancing skeletal lesion. MRI ORBITS FINDINGS Orbits: Globe is symmetric and normal bilaterally. No orbital mass or edema. There is dural thickening and enhancement along the planum sphenoidale on the right. See axial postcontrast image 10 and coronal postcontrast image 9. This is in the region of the optic canal. There appears to be some swelling of the optic nerve within the optic canal with surrounding enhancement, series 18, image 20. There is extensive mucosal edema in the adjacent sphenoid sinus and this may represent inflammatory or infectious process. Remainder of the optic nerves are normal. Pituitary normal.  Cavernous sinus normal. Visualized sinuses: Extensive mucosal edema throughout the paranasal sinuses. Air-fluid levels in the maxillary sinus bilaterally. Soft tissues: No soft tissue mass or edema. IMPRESSION: Dural thickening along the planum sphenoidale on the right. There appears to be edema in the right optic nerve extending through the optic canal with surrounding enhancement suggestive of inflammatory or infectious process likely related to sinusitis. Tumor not likely. No enhancement of the adjacent brain. No brain abscess. Electronically Signed: By: Marlan Palau M.D. On: 03/16/2020 08:57   MR ORBITS W WO CONTRAST  Addendum Date: 03/16/2020   ADDENDUM REPORT: 03/16/2020 09:01 ADDENDUM: These results were called by telephone at the time of interpretation on 03/16/2020 at  9:00 am to provider Hanley Ben, who verbally acknowledged these  results. Electronically Signed   By: Marlan Palau M.D.   On: 03/16/2020 09:01   Result Date: 03/16/2020 CLINICAL DATA:  Right eye vision loss EXAM: MRI HEAD WITH CONTRAST MRI ORBITS WITHOUT AND WITH CONTRAST TECHNIQUE: Multiplanar, multiecho pulse sequences of the brain and surrounding structures were obtained with intravenous contrast. Multiplanar, multiecho pulse sequences of the orbits and surrounding structures were obtained including fat saturation techniques, before and after intravenous contrast administration. CONTRAST:  8mL GADAVIST GADOBUTROL 1 MMOL/ML IV SOLN COMPARISON:  MRI head without with contrast 03/15/2020 FINDINGS: MRI HEAD FINDINGS There is mild dural thickening and enhancement along the planum sphenoidale on the right. This is unchanged from yesterday. Remainder of the enhancement of the brain and surrounding structures is normal. There is extensive associated sinus mucosal disease throughout the paranasal sinuses including the sphenoid sinus bilaterally. See additional discussion below. Pituitary enhances normally. Infundibulum midline. No enhancing skeletal lesion. MRI ORBITS FINDINGS Orbits: Globe is symmetric and normal bilaterally. No orbital mass or edema. There is dural thickening and enhancement along the planum sphenoidale on the right. See axial postcontrast image 10 and coronal postcontrast image 9. This is in the region of the optic canal. There appears to be some swelling of the optic nerve within the optic canal with surrounding enhancement, series 18, image 20. There is extensive mucosal edema in the adjacent sphenoid sinus and this may represent inflammatory or infectious process. Remainder of the optic nerves are normal. Pituitary normal.  Cavernous sinus normal. Visualized sinuses: Extensive mucosal edema throughout the paranasal sinuses. Air-fluid levels in the maxillary sinus bilaterally. Soft tissues: No soft tissue mass or edema. IMPRESSION: Dural thickening along the  planum sphenoidale on the right. There appears to be edema in the right optic nerve extending through the optic canal with surrounding enhancement suggestive of inflammatory or infectious process likely related to sinusitis. Tumor not likely. No enhancement of the adjacent brain. No brain abscess. Electronically Signed: By: Marlan Palau M.D. On: 03/16/2020 08:57    Microbiology: Recent Results (from the past 240 hour(s))  Culture, Urine     Status: Abnormal   Collection Time: 03/15/20  8:00 PM   Specimen: Urine, Random  Result Value Ref Range Status   Specimen Description URINE, RANDOM  Final   Special Requests   Final    NONE Performed at [facility], [address], [place], [place] [address]    Culture MULTIPLE SPECIES PRESENT, SUGGEST RECOLLECTION (A)  Final   Report Status 03/17/2020 FINAL  Final  SARS Coronavirus 2 by RT PCR (hospital order, performed in [facility] hospital lab) Nasopharyngeal Nasopharyngeal Swab     Status: None   Collection Time: 03/16/20  3:39 AM   Specimen: Nasopharyngeal Swab  Result Value Ref Range Status   SARS Coronavirus 2 NEGATIVE NEGATIVE Final    Comment: (NOTE) SARS-CoV-2 target nucleic acids are NOT DETECTED.  The SARS-CoV-2 RNA is generally detectable in upper and lower respiratory specimens during the acute phase of infection. The lowest concentration of SARS-CoV-2 viral copies this assay can detect is 250 copies / mL. A negative result does not preclude SARS-CoV-2 infection and should not be used as the sole basis for treatment or other patient management decisions.  A negative result may occur with improper specimen collection / handling, submission of specimen other than nasopharyngeal swab, presence of viral mutation(s) within the areas targeted by this assay, and inadequate number of viral copies (<  250 copies / mL). A negative result must be combined with clinical observations, patient history, and epidemiological  information.  Fact Sheet for Patients:   BoilerBrush.com.cy  Fact Sheet for Healthcare Providers: https://pope.com/  This test is not yet approved or  cleared by the [place] FDA and has been authorized for detection and/or diagnosis of SARS-CoV-2 by FDA under an Emergency Use Authorization (EUA).  This EUA will remain in effect (meaning this test can be used) for the duration of the COVID-19 declaration under Section 564(b)(1) of the Act, 21 U.S.C. section 360bbb-3(b)(1), unless the authorization is terminated or revoked sooner.  Performed at [facility], [address] [place], [place] [address]      Labs: Basic Metabolic Panel: Recent Labs  Lab 03/14/20 1706 03/15/20 1325 03/17/20 0159  NA 139 140 137  K 3.1* 3.6 3.9  CL 101 100 103  CO2 27 30 26   GLUCOSE 108* 95 164*  BUN 14 11 11   CREATININE 1.05* 0.92 0.92  CALCIUM 9.6 9.8 8.9  MG  --   --  2.0   Liver Function Tests: Recent Labs  Lab 03/14/20 1706 03/15/20 1325  AST 17 14*  ALT 16 16  ALKPHOS 39 40  BILITOT 0.8 1.0  PROT 7.7 7.6  ALBUMIN 4.2 4.1   No results for input(s): LIPASE, AMYLASE in the last 168 hours. No results for input(s): AMMONIA in the last 168 hours. CBC: Recent Labs  Lab 03/14/20 1706 03/15/20 1325 03/17/20 0159  WBC 9.0 9.1 9.8  NEUTROABS 5.7 6.5 9.1*  HGB 14.3 14.9 14.3  HCT 42.5 45.3 40.7  MCV 87.6 86.9 85.3  PLT 267 272 278   Cardiac Enzymes: No results for input(s): CKTOTAL, CKMB, CKMBINDEX, TROPONINI in the last 168 hours. BNP: BNP (last 3 results) No results for input(s): BNP in the last 8760 hours.  ProBNP (last 3 results) No results for input(s): PROBNP in the last 8760 hours.  CBG: No results for input(s): GLUCAP in the last 168 hours.     Signed:  03/17/20  [facility] 03/18/2020, 2:23 PM

## 2020-03-18 NOTE — Progress Notes (Signed)
Pt's IV removed; site is clean, dry and intact. Reviewed discharge instructions with pt; voiced understanding. Pt. packed personal belongings and they were transported with pt. Pt. transported via wheelchair to private vehicle driven by granddaughter. Pt. ordered a BSC but stated she could no longer wait for it to arrive and asked if it could be delivered. Left note for SW.

## 2020-03-18 NOTE — Progress Notes (Signed)
Occupational Therapy Treatment Patient Details Name: Deborah Marsh MRN: [id number] DOB: 1951/04/26 Today's Date: 03/18/2020    History of present illness 69 y.o. female with history of hypertension, COPD, prediabetes previous history of tobacco abuse quit 4 years ago recently treated for COPD exacerbation with steroids and antibiotics presents to the ER because of poor vision in the right eye. Patient has history of left eye central vision loss since age 13. Patient's right blurry vision gradually worsened and presented to the ER at [facility] on August 15th 2021 CT head was done which was unremarkable and was referred to ophthalmologist.  Patient went to the ophthalmologist on March 15, 2020 and was advised to come to the ER for further work-up as per the report from the ER physician ophthalmologist did not see any cause for the right eye vision loss.  By now patient has complete loss of vision on the right eye.    OT comments  Reviewed visual compensatory techniques for low vision and home safety/fall prevention, patient thankful for education and handout left for family.  Reviewed use of services of the blind.  Patient voiced frustration with visual loss and hopeful for it to get better.  OT goals met.  OT will sign off.    Follow Up Recommendations  Home health OT;Supervision - Intermittent    Equipment Recommendations  3 in 1 bedside commode (to use as shower seat )    Recommendations for Other Services Other (comment) (services for the blind )    Precautions / Restrictions Precautions Precautions: Fall       Mobility Bed Mobility                  Transfers                      Balance                                           ADL either performed or assessed with clinical judgement   ADL                                               Vision       Perception     Praxis      Cognition  Arousal/Alertness: Awake/alert Behavior During Therapy: WFL for tasks assessed/performed Overall Cognitive Status: Within Functional Limits for tasks assessed                                          Exercises     Shoulder Instructions       General Comments patient review voiced frustration with loss of vision and hopeful for it return, voiced understanding to recommendations of visual compesnatory techniques for low vision for safety and fall prevention at home, specifically disucssed contrast, lighting and pathways     Pertinent Vitals/ Pain       Pain Assessment: No/denies pain  Home Living  Prior Functioning/Environment              Frequency  Min 2X/week        Progress Toward Goals  OT Goals(current goals can now be found in the care plan section)  Progress towards OT goals: Goals met/education completed, patient discharged from OT  Acute Rehab OT Goals Patient Stated Goal: to have her vision return OT Goal Formulation: With patient  Plan All goals met and education completed, patient discharged from OT services    Co-evaluation                 AM-PAC OT "6 Clicks" Daily Activity     Outcome Measure   Help from another person eating meals?: A Little Help from another person taking care of personal grooming?: A Little Help from another person toileting, which includes using toliet, bedpan, or urinal?: A Little Help from another person bathing (including washing, rinsing, drying)?: A Little Help from another person to put on and taking off regular upper body clothing?: A Little Help from another person to put on and taking off regular lower body clothing?: A Little 6 Click Score: 18    End of Session    OT Visit Diagnosis: Low vision, both eyes (H54.2)   Activity Tolerance Patient tolerated treatment well   Patient Left in bed   Nurse Communication Mobility status         Time: 1423-2009 OT Time Calculation (min): 12 min  Charges: OT General Charges $OT Visit: 1 Visit OT Treatments $Self Care/Home Management : 8-22 mins  Jolaine Artist, OT Acute Rehabilitation Services Pager [phone] Office [phone]     Delight Stare 03/18/2020, 1:30 PM

## 2020-03-18 NOTE — Progress Notes (Signed)
No initial vitals taken by NT/RN. Care nurse not aware until the time of this note d/t pt care and consistent interventions.

## 2020-03-18 NOTE — Progress Notes (Signed)
[facility]  Date of Admission:  03/15/2020      Total days of antibiotics 3  Unasyn           ASSESSMENT: Deborah Marsh is a 69 y.o. female with acute vision loss in the setting of sinusitis and optic nerve swelling without abscess identified on brain imaging. She has continued to decline any surgery and wishes to continue medical therapy with abx + steroids.   ENT and Neurology following. She is on appropriate coverage for sinusitus and would continue through 8/31. OK to convert to PO with augmentin.     PLAN: 1. Change to oral augmentin to complete 14 days through 8/31.  Will sign off at this time - happy to see her back should there be any questions or changes in her condition.    Principal Problem:   Vision loss of right eye Active Problems:   Essential hypertension   COPD (chronic obstructive pulmonary disease) (HCC)    amoxicillin-clavulanate  1 tablet Oral Q12H   aspirin  81 mg Oral Daily   fluticasone  1 spray Each Nare Daily   losartan  25 mg Oral Daily   senna-docusate  1 tablet Oral BID    SUBJECTIVE: No concerns specifically. She is hopeful to go home Saturday.  No changes with vision - still black on the right.    Review of Systems: Review of Systems  Constitutional: Negative for chills and fever.  HENT: Negative for tinnitus.   Eyes: Negative for blurred vision and photophobia.       Absent vision R eye.   Respiratory: Negative for cough and sputum production.   Cardiovascular: Negative for chest pain.  Gastrointestinal: Negative for diarrhea, nausea and vomiting.  Genitourinary: Negative for dysuria.  Skin: Negative for rash.  Neurological: Negative for headaches.    No Known Allergies  OBJECTIVE: Vitals:   03/17/20 1508 03/17/20 2300 03/18/20 0431 03/18/20 0820  BP: 121/68 129/67 133/75 (!) 142/75  Pulse: (!) 56 (!) 44 (!) 43 73  Resp: 20 18 19 14   Temp: 98.2 F (36.8 C) 97.7 F  (36.5 C) 97.7 F (36.5 C) 97.7 F (36.5 C)  TempSrc: Oral Oral Oral Oral  SpO2: 95% 98% 97% 99%   There is no height or weight on file to calculate BMI.  Physical Exam Vitals reviewed.  Constitutional:      Appearance: Normal appearance. She is not ill-appearing.  HENT:     Mouth/Throat:     Mouth: Mucous membranes are moist.     Pharynx: Oropharynx is clear.  Eyes:     General: No scleral icterus.    Comments: Sluggish R pupil   Pulmonary:     Effort: Pulmonary effort is normal.  Neurological:     Mental Status: She is oriented to person, place, and time.  Psychiatric:        Mood and Affect: Mood normal.        Thought Content: Thought content normal.     Lab Results Lab Results  Component Value Date   WBC 9.8 03/17/2020   HGB 14.3 03/17/2020   HCT 40.7 03/17/2020   MCV 85.3 03/17/2020   PLT 278 03/17/2020    Lab Results  Component Value Date   CREATININE 0.92 03/17/2020   BUN 11 03/17/2020   NA 137 03/17/2020   K 3.9 03/17/2020   CL 103 03/17/2020   CO2 26 03/17/2020  Lab Results  Component Value Date   ALT 16 03/15/2020   AST 14 (L) 03/15/2020   ALKPHOS 40 03/15/2020   BILITOT 1.0 03/15/2020     Microbiology: Recent Results (from the past 240 hour(s))  Culture, Urine     Status: Abnormal   Collection Time: 03/15/20  8:00 PM   Specimen: Urine, Random  Result Value Ref Range Status   Specimen Description URINE, RANDOM  Final   Special Requests   Final    NONE Performed at [facility], [address] [place], [place] [address]    Culture MULTIPLE SPECIES PRESENT, SUGGEST RECOLLECTION (A)  Final   Report Status 03/17/2020 FINAL  Final  SARS Coronavirus 2 by RT PCR (hospital order, performed in [facility] hospital lab) Nasopharyngeal Nasopharyngeal Swab     Status: None   Collection Time: 03/16/20  3:39 AM   Specimen: Nasopharyngeal Swab  Result Value Ref Range Status   SARS Coronavirus 2 NEGATIVE NEGATIVE Final    Comment:  (NOTE) SARS-CoV-2 target nucleic acids are NOT DETECTED.  The SARS-CoV-2 RNA is generally detectable in upper and lower respiratory specimens during the acute phase of infection. The lowest concentration of SARS-CoV-2 viral copies this assay can detect is 250 copies / mL. A negative result does not preclude SARS-CoV-2 infection and should not be used as the sole basis for treatment or other patient management decisions.  A negative result may occur with improper specimen collection / handling, submission of specimen other than nasopharyngeal swab, presence of viral mutation(s) within the areas targeted by this assay, and inadequate number of viral copies (<250 copies / mL). A negative result must be combined with clinical observations, patient history, and epidemiological information.  Fact Sheet for Patients:   BoilerBrush.com.cy  Fact Sheet for Healthcare Providers: https://pope.com/  This test is not yet approved or  cleared by the [place] FDA and has been authorized for detection and/or diagnosis of SARS-CoV-2 by FDA under an Emergency Use Authorization (EUA).  This EUA will remain in effect (meaning this test can be used) for the duration of the COVID-19 declaration under Section 564(b)(1) of the Act, 21 U.S.C. section 360bbb-3(b)(1), unless the authorization is terminated or revoked sooner.  Performed at [facility], [address] [place], [place] [address]     Rexene Alberts, MSN, NP-C [facility]  [email or web address] Pager: [phone] Office: [phone] [facility] Main Line: [phone]

## 2020-03-18 NOTE — Discharge Instructions (Signed)
Visual Disturbances  A visual disturbance is any problem that interferes with your normal vision. This can affect one eye or both eyes. Some types of visual disturbances come and go without treatment and do not cause a permanent problem. Other visual disturbances may be a sign of a medical emergency. Visual disturbances include:  Blurred vision.  Being unable to see certain colors.  Being sensitive to light.  Double vision.  Partial vision loss (visual field deficit).  Being unaware of objects on one side of the body (visual spatial inattention).  Rhythmic eye movements that you cannot control (nystagmus).  Short-term or long-term blindness.  Seeing: ? Floating spots or lines (floaters). ? Flashing or shimmering lights. ? Zigzagging lines or stars. ? The floor as tilted (visual midline shift). ? Things that are not really there (hallucinations). Causes of visual disturbances include:  Eye infection.  The thin membrane at the back of the eye separating from the eyeball (retinal detachment).  High blood pressure.  Migraine.  Glaucoma.  Ischemic stroke.  Cerebral aneurysm. It is important to get your eyes checked by a health care provider or eye specialist (ophthalmologist or optometrist) as soon as possible to determine the cause of your visual disturbance. Follow these instructions at home:  Take over-the-counter and prescription medicines only as told by your health care provider.  Do not use any products that contain nicotine or tobacco, such as cigarettes and e-cigarettes. If you need help quitting, ask your health care provider.  To lower your risk of the problems that can lead to visual disturbances: ? Eat a balanced diet that includes fruits and vegetables, whole grains, lean meat, and low-fat dairy. ? Maintain a healthy weight. Work with your health care provider to lose weight if you need to. ? Exercise regularly. Ask your health care provider what  activities are safe for you.  Do not drive if you have trouble seeing. Ask your health care provider for guidance about when it is and is not safe for you to drive.  Keep all follow-up visits as told by your health care provider. This is important. Contact a health care provider if:  Your visual disturbance changes or becomes worse. Get help right away if you:   Have new visual disturbances.  Suddenly see flashing lights or floaters.  Suddenly have a dark area in your field of vision, especially in the lower part. This can lead to a loss of central vision.  Lose vision in one or both eyes.  Have any symptoms of a stroke. "BE FAST" is an easy way to remember the main warning signs of a stroke: ? B - Balance. Signs are dizziness, sudden trouble walking, or loss of balance. ? E - Eyes. Signs are trouble seeing or a sudden change in vision. ? F - Face. Signs are sudden weakness or numbness of the face, or the face or eyelid drooping on one side. ? A - Arms. Signs are weakness or numbness in an arm. This happens suddenly and usually on one side of the body. ? S - Speech. Signs are sudden trouble speaking, slurred speech, or trouble understanding what people say. ? T - Time. Time to call emergency services. Write down what time symptoms started.  Have other signs of a stroke, such as: ? A sudden, severe headache with no known cause. ? Nausea or vomiting. ? Seizure. These symptoms may represent a serious problem that is an emergency. Do not wait to see if the symptoms will go   away. Get medical help right away. Call your local emergency services (911 in the [place] Do not drive yourself to the hospital. Summary  A visual disturbance is any problem that interferes with your normal vision.  Some visual disturbances may be a sign of a medical emergency.  It is important to get your eyes checked by a health care provider or eye specialist to determine what kind of visual disturbance you  have. This information is not intended to replace advice given to you by your health care provider. Make sure you discuss any questions you have with your health care provider. Document Revised: 11/05/2018 Document Reviewed: 08/07/2017 Elsevier Patient Education  2020 Elsevier Inc.  Sinusitis, Adult Sinusitis is soreness and swelling (inflammation) of your sinuses. Sinuses are hollow spaces in the bones around your face. They are located:  Around your eyes.  In the middle of your forehead.  Behind your nose.  In your cheekbones. Your sinuses and nasal passages are lined with a fluid called mucus. Mucus drains out of your sinuses. Swelling can trap mucus in your sinuses. This lets germs (bacteria, virus, or fungus) grow, which leads to infection. Most of the time, this condition is caused by a virus. What are the causes? This condition is caused by:  Allergies.  Asthma.  Germs.  Things that block your nose or sinuses.  Growths in the nose (nasal polyps).  Chemicals or irritants in the air.  Fungus (rare). What increases the risk? You are more likely to develop this condition if:  You have a weak body defense system (immune system).  You do a lot of swimming or diving.  You use nasal sprays too much.  You smoke. What are the signs or symptoms? The main symptoms of this condition are pain and a feeling of pressure around the sinuses. Other symptoms include:  Stuffy nose (congestion).  Runny nose (drainage).  Swelling and warmth in the sinuses.  Headache.  Toothache.  A cough that may get worse at night.  Mucus that collects in the throat or the back of the nose (postnasal drip).  Being unable to smell and taste.  Being very tired (fatigue).  A fever.  Sore throat.  Bad breath. How is this diagnosed? This condition is diagnosed based on:  Your symptoms.  Your medical history.  A physical exam.  Tests to find out if your condition is short-term  (acute) or long-term (chronic). Your doctor may: ? Check your nose for growths (polyps). ? Check your sinuses using a tool that has a light (endoscope). ? Check for allergies or germs. ? Do imaging tests, such as an MRI or CT scan. How is this treated? Treatment for this condition depends on the cause and whether it is short-term or long-term.  If caused by a virus, your symptoms should go away on their own within 10 days. You may be given medicines to relieve symptoms. They include: ? Medicines that shrink swollen tissue in the nose. ? Medicines that treat allergies (antihistamines). ? A spray that treats swelling of the nostrils. ? Rinses that help get rid of thick mucus in your nose (nasal saline washes).  If caused by bacteria, your doctor may wait to see if you will get better without treatment. You may be given antibiotic medicine if you have: ? A very bad infection. ? A weak body defense system.  If caused by growths in the nose, you may need to have surgery. Follow these instructions at home: Medicines  Take,  use, or apply over-the-counter and prescription medicines only as told by your doctor. These may include nasal sprays.  If you were prescribed an antibiotic medicine, take it as told by your doctor. Do not stop taking the antibiotic even if you start to feel better. Hydrate and humidify   Drink enough water to keep your pee (urine) pale yellow.  Use a cool mist humidifier to keep the humidity level in your home above 50%.  Breathe in steam for 10-15 minutes, 3-4 times a day, or as told by your doctor. You can do this in the bathroom while a hot shower is running.  Try not to spend time in cool or dry air. Rest  Rest as much as you can.  Sleep with your head raised (elevated).  Make sure you get enough sleep each night. General instructions   Put a warm, moist washcloth on your face 3-4 times a day, or as often as told by your doctor. This will help with  discomfort.  Wash your hands often with soap and water. If there is no soap and water, use hand sanitizer.  Do not smoke. Avoid being around people who are smoking (secondhand smoke).  Keep all follow-up visits as told by your doctor. This is important. Contact a doctor if:  You have a fever.  Your symptoms get worse.  Your symptoms do not get better within 10 days. Get help right away if:  You have a very bad headache.  You cannot stop throwing up (vomiting).  You have very bad pain or swelling around your face or eyes.  You have trouble seeing.  You feel confused.  Your neck is stiff.  You have trouble breathing. Summary  Sinusitis is swelling of your sinuses. Sinuses are hollow spaces in the bones around your face.  This condition is caused by tissues in your nose that become inflamed or swollen. This traps germs. These can lead to infection.  If you were prescribed an antibiotic medicine, take it as told by your doctor. Do not stop taking it even if you start to feel better.  Keep all follow-up visits as told by your doctor. This is important. This information is not intended to replace advice given to you by your health care provider. Make sure you discuss any questions you have with your health care provider. Document Revised: 12/17/2017 Document Reviewed: 12/17/2017 Elsevier Patient Education  2020 [employer].

## 2020-03-18 NOTE — Progress Notes (Signed)
Her right pupil is unreactive with no light perception.  At this point, she has refused surgery after discussion with ENT and therefore medical management consists of steroids and antibiotics.  The initial dose of 1 g a day was chosen due to concern for optic neuritis, but given that this is inflammation around the nerve as opposed to direct optic neuritis, I do not think there is reason to use such high doses of steroids. I will stop IV steroids, could use 100mg  prednisone to finish 5 day course(today is day 3) followed by rapid taper over a few days. Could discuss with ophthalmology if they have any other recommendations.  Neurology will remain available for any questions, please call if we can be of any further assistance.    , MD [facility] [phone]  If 7pm- 7am, please page neurology on call as listed in AMION.

## 2020-03-22 LAB — MISC LABCORP TEST (SEND OUT): Labcorp test code: 505310

## 2020-03-26 LAB — ANTI-MYELIN ASSOC GLYCOP IGG: Anti-Myelin Assoc Glycop IgG: <1:10 {titer}

## 2022-11-12 ENCOUNTER — Emergency Department (HOSPITAL_COMMUNITY): Payer: Medicare HMO

## 2022-11-12 ENCOUNTER — Emergency Department (HOSPITAL_COMMUNITY)
Admission: EM | Admit: 2022-11-12 | Discharge: 2022-11-12 | Disposition: A | Discharge status: Home / Self Care | Payer: Medicare HMO | Attending: Emergency Medicine | Admitting: Emergency Medicine

## 2022-11-12 ENCOUNTER — Other Ambulatory Visit: Payer: Self-pay

## 2022-11-12 ENCOUNTER — Encounter (HOSPITAL_COMMUNITY): Payer: Self-pay

## 2022-11-12 DIAGNOSIS — Z79899 Other long term (current) drug therapy: Secondary | ICD-10-CM | POA: Diagnosis not present

## 2022-11-12 DIAGNOSIS — J441 Chronic obstructive pulmonary disease with (acute) exacerbation: Secondary | ICD-10-CM

## 2022-11-12 DIAGNOSIS — Z7982 Long term (current) use of aspirin: Secondary | ICD-10-CM | POA: Diagnosis not present

## 2022-11-12 DIAGNOSIS — I10 Essential (primary) hypertension: Secondary | ICD-10-CM | POA: Insufficient documentation

## 2022-11-12 DIAGNOSIS — R600 Localized edema: Secondary | ICD-10-CM

## 2022-11-12 DIAGNOSIS — R0602 Shortness of breath: Secondary | ICD-10-CM | POA: Diagnosis present

## 2022-11-12 HISTORY — DX: Anxiety disorder, unspecified: F41.9

## 2022-11-12 HISTORY — DX: Chronic rhinitis: J31.0

## 2022-11-12 HISTORY — DX: Unspecified optic neuritis: H46.9

## 2022-11-12 HISTORY — DX: Unspecified asthma, uncomplicated: J45.909

## 2022-11-12 LAB — BRAIN NATRIURETIC PEPTIDE: B Natriuretic Peptide: 60 pg/mL (ref 0.0–100.0)

## 2022-11-12 LAB — CBC WITH DIFFERENTIAL/PLATELET
Abs Immature Granulocytes: 0.02 10*3/uL (ref 0.00–0.07)
Basophils Absolute: 0 10*3/uL (ref 0.0–0.1)
Basophils Relative: 0 %
Eosinophils Absolute: 0.2 10*3/uL (ref 0.0–0.5)
Eosinophils Relative: 3 %
HCT: 38.2 % (ref 36.0–46.0)
Hemoglobin: 13.1 g/dL (ref 12.0–15.0)
Immature Granulocytes: 0 %
Lymphocytes Relative: 20 %
Lymphs Abs: 1.4 10*3/uL (ref 0.7–4.0)
MCH: 29.9 pg (ref 26.0–34.0)
MCHC: 34.3 g/dL (ref 30.0–36.0)
MCV: 87.2 fL (ref 80.0–100.0)
Monocytes Absolute: 0.6 10*3/uL (ref 0.1–1.0)
Monocytes Relative: 8 %
Neutro Abs: 5.1 10*3/uL (ref 1.7–7.7)
Neutrophils Relative %: 69 %
Platelets: 195 10*3/uL (ref 150–400)
RBC: 4.38 MIL/uL (ref 3.87–5.11)
RDW: 14.2 % (ref 11.5–15.5)
WBC: 7.4 10*3/uL (ref 4.0–10.5)
nRBC: 0 % (ref 0.0–0.2)

## 2022-11-12 LAB — BASIC METABOLIC PANEL
Anion gap: 8 (ref 5–15)
BUN: 13 mg/dL (ref 8–23)
CO2: 28 mmol/L (ref 22–32)
Calcium: 8.9 mg/dL (ref 8.9–10.3)
Chloride: 103 mmol/L (ref 98–111)
Creatinine, Ser: 0.92 mg/dL (ref 0.44–1.00)
GFR, Estimated: >60 mL/min (ref >60)
Glucose, Bld: 85 mg/dL (ref 70–99)
Potassium: 3.8 mmol/L (ref 3.5–5.1)
Sodium: 139 mmol/L (ref 135–145)

## 2022-11-12 LAB — TROPONIN I (HIGH SENSITIVITY)
Troponin I (High Sensitivity): 4 ng/L (ref <18)
Troponin I (High Sensitivity): 4 ng/L (ref <18)

## 2022-11-12 MED ORDER — HYDROCHLOROTHIAZIDE 12.5 MG PO TABS: 25.0000 mg | ORAL_TABLET | Freq: Every day | ORAL | 0 refills | Status: DC

## 2022-11-12 MED ORDER — FACE MASK EARLOOP-STYLE MISC: 1.0000 [IU] | Freq: Every day | 0 refills | Status: DC | PRN

## 2022-11-12 MED ORDER — IPRATROPIUM-ALBUTEROL 0.5-2.5 (3) MG/3ML IN SOLN: 3.0000 mL | RESPIRATORY_TRACT | 0 refills | Status: AC | PRN

## 2022-11-12 MED ORDER — IPRATROPIUM-ALBUTEROL 0.5-2.5 (3) MG/3ML IN SOLN
3.0000 mL | Freq: Once | RESPIRATORY_TRACT | Status: AC
  Administered 2022-11-12: 3 mL via RESPIRATORY_TRACT
  Filled 2022-11-12: qty 3

## 2022-11-12 MED ORDER — IPRATROPIUM-ALBUTEROL 0.5-2.5 (3) MG/3ML IN SOLN: 3.0000 mL | RESPIRATORY_TRACT | 0 refills | Status: DC | PRN

## 2022-11-12 NOTE — ED Notes (Signed)
Pt to xr 

## 2022-11-12 NOTE — ED Notes (Signed)
Called resp for neb  

## 2022-11-12 NOTE — ED Provider Notes (Signed)
[place] EMERGENCY DEPARTMENT AT [facility] Provider Note   CSN: [id number] Arrival date & time: 11/12/22  4098     History  Chief Complaint  Patient presents with   fluid retention    Deborah Marsh is a 72 y.o. female.  The history is provided by the patient.  She has history of hypertension, COPD and comes in with several complaints.  She has chronic problems with wheezing but wheezing has been more constant over the last 2 weeks.  She has been using her home nebulizer and inhaler which gives temporary relief.  She has not had make cough or fever.  She has chronic, intermittent left arm pain which has become more frequent over the last 2 weeks.  Pain is a dull ache in the upper arm and comes intermittently.  For the last week, she has noted increasing fluid retention and estimates a 10 pound weight gain during that time.  She denies chest pain, heaviness, tightness, pressure.  She denies nausea or vomiting.  She is concerned that her kidneys may not be working well.   Home Medications Prior to Admission medications   Medication Sig Start Date End Date Taking? Authorizing Provider  albuterol (PROVENTIL) (2.5 MG/3ML) 0.083% nebulizer solution Take 2.5 mg by nebulization every 6 (six) hours as needed for wheezing or shortness of breath.  03/01/20   [provider]  albuterol (VENTOLIN HFA) 108 (90 Base) MCG/ACT inhaler Inhale 2 puffs into the lungs every 6 (six) hours as needed for wheezing or shortness of breath.    [provider]  ALPRAZolam Prudy Feeler) 0.5 MG tablet Take 0.5 mg by mouth 2 (two) times daily as needed for anxiety.    [provider]  APPLE CIDER VINEGAR PO Take 1 tablet by mouth daily.    [provider]  aspirin 81 MG chewable tablet Chew 1 tablet (81 mg total) by mouth daily. 03/19/20   Mikhail, Nita Sells, DO  Cholecalciferol (VITAMIN D-3 PO) Take 1 capsule by mouth daily.    [provider]  Cinnamon 500 MG  capsule Take 500 mg by mouth daily.    [provider]  fluticasone (FLONASE) 50 MCG/ACT nasal spray Place 1 spray into both nostrils daily.    [provider]  GARLIC PO Take 1 tablet by mouth daily.    [provider]  ketoconazole (NIZORAL) 2 % cream Apply 1 application topically 2 (two) times daily as needed for irritation.    [provider]  losartan (COZAAR) 25 MG tablet Take 25 mg by mouth daily. 01/29/20   [provider]  traMADol (ULTRAM) 50 MG tablet Take 50 mg by mouth 2 (two) times daily as needed for moderate pain.    [provider]  triamterene-hydrochlorothiazide (MAXZIDE-25) 37.5-25 MG tablet Take 1 tablet by mouth daily.    [provider]      Allergies    Patient has no known allergies.    Review of Systems   Review of Systems  All other systems reviewed and are negative.   Physical Exam Updated Vital Signs Ht  (1.626 m)   Wt 82.6 kg   BMI 31.26 kg/m  Physical Exam Vitals and nursing note reviewed.   72 year old female, resting comfortably and in no acute distress. Vital signs are significant for elevated blood pressure. Oxygen saturation is 100%, which is normal. Head is normocephalic and atraumatic. PERRLA, EOMI. Oropharynx is clear. Neck is nontender and supple without adenopathy  or JVD. Back is nontender and there is no CVA tenderness.  There is no presacral edema. Lungs have diffuse expiratory wheezes.  There are no rales or rhonchi. Chest is nontender. Heart has regular rate and rhythm without murmur. Abdomen is soft, flat, nontender. Extremities have 1-2+ pitting edema, full range of motion is present. Skin is warm and dry without rash. Neurologic: Mental status is normal, cranial nerves are intact, moves all extremities equally.  ED Results / Procedures / Treatments   Labs (all labs ordered are listed, but only abnormal results are displayed) Labs Reviewed  CBC WITH  DIFFERENTIAL/PLATELET  BASIC METABOLIC PANEL  BRAIN NATRIURETIC PEPTIDE  TROPONIN I (HIGH SENSITIVITY)  TROPONIN I (HIGH SENSITIVITY)    EKG EKG Interpretation  Date/Time:  Sunday November 12 2022 06:13:50 EDT Ventricular Rate:  54 PR Interval:  163 QRS Duration: 110 QT Interval:  453 QTC Calculation: 430 R Axis:   -48 Text Interpretation: Sinus rhythm Left anterior fascicular block Low voltage, precordial leads Abnormal R-wave progression, early transition Nonspecific T abnormalities, diffuse leads When compared with ECG of 03/16/2020, No significant change was found Confirmed by Dione Booze ([id number]) on 11/12/2022 6:21:07 AM  Radiology DG Chest 2 View  Result Date: 11/12/2022 CLINICAL DATA:  Shortness of breath. EXAM: CHEST - 2 VIEW COMPARISON:  None Available. FINDINGS: Normal cardiomediastinal contours. Aortic atherosclerosis. No pleural fluid or airspace disease. Visualized osseous structures are unremarkable. IMPRESSION: No active cardiopulmonary disease. Electronically Signed   By: Signa Kell M.D.   On: 11/12/2022 06:36    Procedures Procedures  Cardiac monitor shows normal sinus rhythm, per my interpretation.  Medications Ordered in ED Medications - No data to display  ED Course/ Medical Decision Making/ A&P                             Medical Decision Making Amount and/or Complexity of Data Reviewed Labs: ordered. Radiology: ordered.  Risk Prescription drug management.   Peripheral edema which may represent heart failure versus renal insufficiency.  Wheezing which is most likely COPD exacerbation.  I have ordered a chest x-ray to rule out pneumonia.  I have ordered BNP to evaluate for possible heart failure.  Left arm pain of uncertain cause but appears benign.  I have ordered ECG and troponin x 2 to evaluate for possible ACS.  I have ordered a nebulizer treatment with albuterol and ipratropium.  I have reviewed and interpreted her electrocardiogram and my  interpretation is nonspecific T wave changes which are unchanged from prior.  Chest x-ray shows no acute cardiopulmonary disease.  I independently viewed the images, and agree with radiologist interpretation.  I have reviewed her laboratory tests which are back and her CBC is normal, metabolic panel and troponin and BNP are pending.  She had excellent relief of her wheezing with nebulizer treatment with albuterol and ipratropium and she states that it works better than her home albuterol and is requesting a prescription for that.  I have sent in a prescription for albuterol with ipratropium nebulizer solution.  She is also requesting a cardiology referral which I have set up.  Case is signed out to Dr. Particia Nearing to evaluate her laboratory tests.  I anticipate she will need to be discharged on a short course of prednisone as well as a prescription for a diuretic.  Final Clinical Impression(s) / ED Diagnoses Final diagnoses:  COPD exacerbation  Peripheral edema    Rx / DC Orders  ED Discharge Orders          Ordered    Ambulatory referral to Cardiology       Comments: If you have not heard from the Cardiology office within the next 72 hours please call [phone].   11/12/22 0710    ipratropium-albuterol (DUONEB) 0.5-2.5 (3) MG/3ML SOLN  Every 4 hours PRN        11/12/22 0710              Dione Booze, MD 11/12/22 [phone]

## 2022-11-12 NOTE — ED Triage Notes (Signed)
Pt arrived via POV from home c/o fluid retention, recent weight gain and left arm pain. Pt has audible wheezing in Triage and reports Hx of asthma and bronchitis. Pt reports using inhaler at home PTA w/o relief.

## 2022-11-12 NOTE — ED Notes (Signed)
ED Provider at bedside. 

## 2022-11-12 NOTE — ED Provider Notes (Signed)
Pt signed out by Dr. Preston Fleeting pending labs and improvement.  CBC, BMP, BNP, and trop nl.  Pt feels much better after the duoneb.  She is given a rx for the duoneb and a rx for a nebulizer.  She has some mild peripheral edema, so is given a rx for hctz to see if that helps.  She is given a referral to cards per her request.  Pt is stable for d/c.  Return if worse.  F/u with pcp.   Jacalyn Lefevre, MD 11/12/22 [phone]

## 2023-01-01 ENCOUNTER — Encounter: Payer: Self-pay | Admitting: *Deleted

## 2023-01-01 ENCOUNTER — Ambulatory Visit: Payer: Medicare HMO | Attending: Internal Medicine | Admitting: Internal Medicine

## 2023-01-01 ENCOUNTER — Encounter: Payer: Self-pay | Admitting: Internal Medicine

## 2023-01-01 ENCOUNTER — Telehealth: Payer: Self-pay | Admitting: Internal Medicine

## 2023-01-01 VITALS — BP 124/70 | HR 66 | Ht 64.0 in | Wt 178.8 lb

## 2023-01-01 DIAGNOSIS — R0609 Other forms of dyspnea: Secondary | ICD-10-CM | POA: Insufficient documentation

## 2023-01-01 DIAGNOSIS — R079 Chest pain, unspecified: Secondary | ICD-10-CM | POA: Diagnosis not present

## 2023-01-01 NOTE — Progress Notes (Signed)
Cardiology Office Note  Date: 01/01/2023   ID: Deborah Marsh, DOB 1951-04-29, MRN [id number]  PCP:  Catalina Lunger, DO  Cardiologist:  Marjo Bicker, MD Electrophysiologist:  None   Reason for Office Visit: Evaluation of DOE and chest pain the request of Dr. Preston Fleeting   History of Present Illness: Deborah Marsh is a 72 y.o. female known to have COPD, HTN, prediabetes, optic neuropathy was referred to cardiology clinic for evaluation of DOE and chest pain.  Patient was having worsening DOE since 07/2022 (although she had SOB for quite a while and all her life). She had ER visit in 10/2022 for worsening DOE and a one-time occurrence of chest pain radiating to left arm lasting for 1 hour. Symptoms resolved with a nebulization treatment. EKG showed NSR and no ischemia however has some nonspecific T wave inversions in the inferior leads. Troponins are within normal limits.  No dizziness, palpitations, leg swelling and syncope.  Past Medical History:  Diagnosis Date   Anxiety    Asthma    Chronic rhinitis    COPD (chronic obstructive pulmonary disease) (HCC)    Diabetes mellitus without complication (HCC)    Hypertension    Optic neuropathy    bilateral    Past Surgical History:  Procedure Laterality Date   NASAL SINUS SURGERY      Current Outpatient Medications  Medication Sig Dispense Refill   albuterol (PROVENTIL) (2.5 MG/3ML) 0.083% nebulizer solution Take 2.5 mg by nebulization every 6 (six) hours as needed for wheezing or shortness of breath.      albuterol (VENTOLIN HFA) 108 (90 Base) MCG/ACT inhaler Inhale 2 puffs into the lungs every 6 (six) hours as needed for wheezing or shortness of breath.     ALPRAZolam (XANAX) 0.5 MG tablet Take 0.5 mg by mouth 3 (three) times daily as needed for anxiety.     ascorbic acid (VITAMIN C) 500 MG tablet Take 1 tablet by mouth daily.     aspirin 81 MG chewable tablet Chew 1 tablet (81 mg total) by mouth daily.      atorvastatin (LIPITOR) 10 MG tablet Take 10 mg by mouth daily.     Cholecalciferol (VITAMIN D-3 PO) Take 1 capsule by mouth daily.     fluticasone (FLONASE) 50 MCG/ACT nasal spray Place 1 spray into both nostrils daily.     GARLIC PO Take 1 tablet by mouth daily.     ketoconazole (NIZORAL) 2 % cream Apply 1 application topically 2 (two) times daily as needed for irritation.     loratadine (CLARITIN) 10 MG tablet Take 1 tablet by mouth daily.     losartan (COZAAR) 25 MG tablet Take 25 mg by mouth daily.     Omega-3 Fatty Acids (FISH OIL) 1000 MG CAPS Take 1 capsule by mouth daily.     ipratropium-albuterol (DUONEB) 0.5-2.5 (3) MG/3ML SOLN Take 3 mLs by nebulization every 4 (four) hours as needed. (Patient not taking: Reported on 01/01/2023) 360 mL 0   No current facility-administered medications for this visit.   Allergies:  Patient has no known allergies.   Social History: The patient  reports that she has quit smoking. She has never used smokeless tobacco. She reports that she does not drink alcohol and does not use drugs.   Family History: The patient's family history includes Glaucoma in her mother.   ROS:  Please see the history of present illness. Otherwise, complete review of systems is positive for none  All other systems are reviewed and negative.   Physical Exam: VS:  BP 124/70   Pulse 66   Ht 5\' 4"  (1.626 m)   Wt 178 lb 12.8 oz (81.1 kg)   SpO2 97%   BMI 30.69 kg/m , BMI Body mass index is 30.69 kg/m.  Wt Readings from Last 3 Encounters:  01/01/23 178 lb 12.8 oz (81.1 kg)  11/12/22 182 lb 1.6 oz (82.6 kg)  03/14/20 187 lb (84.8 kg)    General: Patient appears comfortable at rest. HEENT: Conjunctiva and lids normal, oropharynx clear with moist mucosa. Neck: Supple, no elevated JVP or carotid bruits, no thyromegaly. Lungs: Clear to auscultation, nonlabored breathing at rest. Cardiac: Regular rate and rhythm, no S3 or significant systolic murmur, no pericardial  rub. Abdomen: Soft, nontender, no hepatomegaly, bowel sounds present, no guarding or rebound. Extremities: No pitting edema, distal pulses 2+. Skin: Warm and dry. Musculoskeletal: No kyphosis. Neuropsychiatric: Alert and oriented x3, affect grossly appropriate.  Recent Labwork: 11/12/2022: B Natriuretic Peptide 60.0; BUN 13; Creatinine, Ser 0.92; Hemoglobin 13.1; Platelets 195; Potassium 3.8; Sodium 139     Component Value Date/Time   CHOL 200 03/16/2020 0539   TRIG 89 03/16/2020 0539   HDL 71 03/16/2020 0539   CHOLHDL 2.8 03/16/2020 0539   VLDL 18 03/16/2020 0539   LDLCALC 111 (H) 03/16/2020 0539    Assessment and Plan:  Patient is a 72 year old F known to have HTN, COPD, optic neuropathy was referred to cardiology clinic for evaluation of DOE/chest pain.  # DOE and chest pain: Patient has worsening DOE since 07/2022 and 1 time occurrence of chest pain in 4/24 prompting ER visit. Cardiac risk factors are HTN and advanced age.  Obtain Lexiscan.  # HTN, controlled: Continue losartan 25 mg once daily, HTN per PCP.   I have spent a total of 45 minutes with patient reviewing chart, EKGs, labs and examining patient as well as establishing an assessment and plan that was discussed with the patient.  > 50% of time was spent in direct patient care.    Medication Adjustments/Labs and Tests Ordered: Current medicines are reviewed at length with the patient today.  Concerns regarding medicines are outlined above.   Tests Ordered: Orders Placed This Encounter  Procedures   NM Myocar Multi W/Spect W/Wall Motion / EF    Medication Changes: No orders of the defined types were placed in this encounter.   Disposition:  Follow up  pending results  Signed Nakira Litzau Verne Spurr, MD, 01/01/2023 3:02 PM    [facility] at [facility] [address] [place], [place], [place] [address]

## 2023-01-01 NOTE — Telephone Encounter (Signed)
Checking percert on the following patient for testing scheduled at [facility].    LEXISCAN  01/15/2023

## 2023-01-01 NOTE — Patient Instructions (Addendum)

## 2023-01-15 ENCOUNTER — Encounter (HOSPITAL_COMMUNITY)
Admission: RE | Admit: 2023-01-15 | Discharge: 2023-01-15 | Disposition: A | Discharge status: Home / Self Care | Payer: Medicare HMO | Source: Ambulatory Visit | Attending: Internal Medicine | Admitting: Internal Medicine

## 2023-01-15 ENCOUNTER — Ambulatory Visit (HOSPITAL_COMMUNITY)
Admission: RE | Admit: 2023-01-15 | Discharge: 2023-01-15 | Disposition: A | Discharge status: Home / Self Care | Payer: Medicare HMO | Source: Ambulatory Visit | Attending: Internal Medicine | Admitting: Internal Medicine

## 2023-01-15 DIAGNOSIS — R079 Chest pain, unspecified: Secondary | ICD-10-CM | POA: Diagnosis present

## 2023-01-15 DIAGNOSIS — R0609 Other forms of dyspnea: Secondary | ICD-10-CM

## 2023-01-15 LAB — NM MYOCAR MULTI W/SPECT W/WALL MOTION / EF
LV dias vol: 68 mL (ref 46–106)
LV sys vol: 22 mL
Nuc Stress EF: 68 %
Peak HR: 97 {beats}/min
RATE: 0.3
Rest HR: 66 {beats}/min
Rest Nuclear Isotope Dose: 10.7 mCi
SDS: 1
SRS: 1
SSS: 2
ST Depression (mm): 0 mm
Stress Nuclear Isotope Dose: 31.2 mCi
TID: 0.91

## 2023-01-15 MED ORDER — SODIUM CHLORIDE FLUSH 0.9 % IV SOLN
INTRAVENOUS | Status: AC
  Administered 2023-01-15: 10 mL via INTRAVENOUS
  Filled 2023-01-15: qty 10

## 2023-01-15 MED ORDER — TECHNETIUM TC 99M TETROFOSMIN IV KIT
10.7000 | PACK | Freq: Once | INTRAVENOUS | Status: AC | PRN
  Administered 2023-01-15: 10.7 via INTRAVENOUS

## 2023-01-15 MED ORDER — REGADENOSON 0.4 MG/5ML IV SOLN
INTRAVENOUS | Status: AC
  Administered 2023-01-15: 0.4 mg via INTRAVENOUS
  Filled 2023-01-15: qty 5

## 2023-01-15 MED ORDER — TECHNETIUM TC 99M TETROFOSMIN IV KIT
31.2000 | PACK | Freq: Once | INTRAVENOUS | Status: AC | PRN
  Administered 2023-01-15: 31.2 via INTRAVENOUS

## 2023-10-20 ENCOUNTER — Other Ambulatory Visit (HOSPITAL_COMMUNITY): Payer: Self-pay | Admitting: Nephrology

## 2023-10-20 DIAGNOSIS — N1831 Chronic kidney disease, stage 3a: Secondary | ICD-10-CM

## 2023-11-05 ENCOUNTER — Other Ambulatory Visit (HOSPITAL_COMMUNITY)

## 2023-11-27 ENCOUNTER — Ambulatory Visit (HOSPITAL_COMMUNITY)
Admission: RE | Admit: 2023-11-27 | Discharge: 2023-11-27 | Disposition: A | Discharge status: Home / Self Care | Source: Ambulatory Visit | Attending: Nephrology | Admitting: Nephrology

## 2023-11-27 DIAGNOSIS — N1831 Chronic kidney disease, stage 3a: Secondary | ICD-10-CM | POA: Diagnosis present

## 2024-03-21 ENCOUNTER — Telehealth: Payer: Self-pay | Admitting: Internal Medicine

## 2024-03-21 NOTE — Telephone Encounter (Signed)
 Spoke with patient and stated that Dr. Rachele was ordering an Echo advised her that once he places the order someone from their scheduling office will call her to schedule it. Once the results come back they should be shared with Dr. Mallipeddi and will let her know further recommendations. Patient verbalized understanding

## 2024-03-21 NOTE — Telephone Encounter (Signed)
 Pt is requesting a callback regarding her order being placed for an ECHO. Please advise

## 2024-03-24 ENCOUNTER — Encounter: Payer: Self-pay | Admitting: Internal Medicine
